# Patient Record
Sex: Female | Born: 1985 | Race: White | Hispanic: No | Marital: Single
Health system: Southern US, Community
[De-identification: ages and names within clinical notes are randomized; demographics above are authoritative.]

## PROBLEM LIST (undated history)

## (undated) ENCOUNTER — Inpatient Hospital Stay (HOSPITAL_COMMUNITY): Payer: Self-pay

## (undated) DIAGNOSIS — R000 Tachycardia, unspecified: Secondary | ICD-10-CM

## (undated) DIAGNOSIS — I469 Cardiac arrest, cause unspecified: Secondary | ICD-10-CM

## (undated) DIAGNOSIS — O24419 Gestational diabetes mellitus in pregnancy, unspecified control: Secondary | ICD-10-CM

## (undated) DIAGNOSIS — M549 Dorsalgia, unspecified: Secondary | ICD-10-CM

## (undated) DIAGNOSIS — R51 Headache: Secondary | ICD-10-CM

## (undated) DIAGNOSIS — F419 Anxiety disorder, unspecified: Secondary | ICD-10-CM

---
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
## 2006-01-04 ENCOUNTER — Inpatient Hospital Stay (HOSPITAL_COMMUNITY): Admission: AD | Admit: 2006-01-04 | Discharge: 2006-01-04 | Payer: Self-pay | Admitting: Obstetrics and Gynecology

## 2006-04-03 ENCOUNTER — Emergency Department (HOSPITAL_COMMUNITY): Admission: EM | Admit: 2006-04-03 | Discharge: 2006-04-03 | Payer: Self-pay | Admitting: Emergency Medicine

## 2006-04-22 ENCOUNTER — Emergency Department (HOSPITAL_COMMUNITY): Admission: EM | Admit: 2006-04-22 | Discharge: 2006-04-22 | Payer: Self-pay | Admitting: Emergency Medicine

## 2006-04-24 ENCOUNTER — Emergency Department (HOSPITAL_COMMUNITY): Admission: EM | Admit: 2006-04-24 | Discharge: 2006-04-24 | Payer: Self-pay | Admitting: Emergency Medicine

## 2006-06-03 ENCOUNTER — Inpatient Hospital Stay (HOSPITAL_COMMUNITY): Admission: AD | Admit: 2006-06-03 | Discharge: 2006-06-03 | Payer: Self-pay | Admitting: Gynecology

## 2006-07-21 ENCOUNTER — Emergency Department (HOSPITAL_COMMUNITY): Admission: EM | Admit: 2006-07-21 | Discharge: 2006-07-22 | Payer: Self-pay | Admitting: Emergency Medicine

## 2006-08-11 ENCOUNTER — Emergency Department (HOSPITAL_COMMUNITY): Admission: EM | Admit: 2006-08-11 | Discharge: 2006-08-11 | Payer: Self-pay | Admitting: Emergency Medicine

## 2006-08-24 ENCOUNTER — Inpatient Hospital Stay (HOSPITAL_COMMUNITY): Admission: AD | Admit: 2006-08-24 | Discharge: 2006-08-24 | Payer: Self-pay | Admitting: Gynecology

## 2006-09-08 ENCOUNTER — Inpatient Hospital Stay (HOSPITAL_COMMUNITY): Admission: AD | Admit: 2006-09-08 | Discharge: 2006-09-08 | Payer: Self-pay | Admitting: Obstetrics & Gynecology

## 2006-10-17 ENCOUNTER — Emergency Department (HOSPITAL_COMMUNITY): Admission: EM | Admit: 2006-10-17 | Discharge: 2006-10-17 | Payer: Self-pay | Admitting: Emergency Medicine

## 2006-11-10 ENCOUNTER — Inpatient Hospital Stay (HOSPITAL_COMMUNITY): Admission: AD | Admit: 2006-11-10 | Discharge: 2006-11-11 | Payer: Self-pay | Admitting: Obstetrics & Gynecology

## 2006-11-19 ENCOUNTER — Inpatient Hospital Stay (HOSPITAL_COMMUNITY): Admission: AD | Admit: 2006-11-19 | Discharge: 2006-11-20 | Payer: Self-pay | Admitting: Obstetrics and Gynecology

## 2006-11-25 ENCOUNTER — Inpatient Hospital Stay (HOSPITAL_COMMUNITY): Admission: AD | Admit: 2006-11-25 | Discharge: 2006-11-25 | Payer: Self-pay | Admitting: Obstetrics & Gynecology

## 2006-12-09 ENCOUNTER — Inpatient Hospital Stay (HOSPITAL_COMMUNITY): Admission: AD | Admit: 2006-12-09 | Discharge: 2006-12-09 | Payer: Self-pay | Admitting: Family Medicine

## 2006-12-24 ENCOUNTER — Emergency Department (HOSPITAL_COMMUNITY): Admission: EM | Admit: 2006-12-24 | Discharge: 2006-12-25 | Payer: Self-pay | Admitting: Emergency Medicine

## 2007-01-06 ENCOUNTER — Inpatient Hospital Stay (HOSPITAL_COMMUNITY): Admission: AD | Admit: 2007-01-06 | Discharge: 2007-01-06 | Payer: Self-pay | Admitting: Obstetrics & Gynecology

## 2007-01-06 ENCOUNTER — Ambulatory Visit: Payer: Self-pay | Admitting: Obstetrics & Gynecology

## 2007-01-17 ENCOUNTER — Inpatient Hospital Stay (HOSPITAL_COMMUNITY): Admission: AD | Admit: 2007-01-17 | Discharge: 2007-01-18 | Payer: Self-pay | Admitting: Obstetrics & Gynecology

## 2007-01-17 ENCOUNTER — Ambulatory Visit: Payer: Self-pay | Admitting: Physician Assistant

## 2007-02-07 ENCOUNTER — Inpatient Hospital Stay (HOSPITAL_COMMUNITY): Admission: AD | Admit: 2007-02-07 | Discharge: 2007-02-07 | Payer: Self-pay | Admitting: Family Medicine

## 2007-02-07 ENCOUNTER — Ambulatory Visit: Payer: Self-pay | Admitting: Obstetrics and Gynecology

## 2007-03-05 ENCOUNTER — Inpatient Hospital Stay (HOSPITAL_COMMUNITY): Admission: AD | Admit: 2007-03-05 | Discharge: 2007-03-05 | Payer: Self-pay | Admitting: Obstetrics & Gynecology

## 2007-03-05 ENCOUNTER — Ambulatory Visit: Payer: Self-pay | Admitting: Obstetrics and Gynecology

## 2007-03-25 ENCOUNTER — Ambulatory Visit: Payer: Self-pay | Admitting: *Deleted

## 2007-03-25 ENCOUNTER — Encounter (INDEPENDENT_AMBULATORY_CARE_PROVIDER_SITE_OTHER): Payer: Self-pay | Admitting: *Deleted

## 2007-04-01 ENCOUNTER — Ambulatory Visit: Payer: Self-pay | Admitting: Family Medicine

## 2007-04-01 ENCOUNTER — Ambulatory Visit: Payer: Self-pay | Admitting: *Deleted

## 2007-04-01 ENCOUNTER — Inpatient Hospital Stay (HOSPITAL_COMMUNITY): Admission: AD | Admit: 2007-04-01 | Discharge: 2007-04-01 | Payer: Self-pay | Admitting: Family Medicine

## 2007-04-06 ENCOUNTER — Ambulatory Visit: Payer: Self-pay | Admitting: Obstetrics & Gynecology

## 2007-04-06 ENCOUNTER — Encounter: Admission: RE | Admit: 2007-04-06 | Discharge: 2007-04-06 | Payer: Self-pay | Admitting: Obstetrics & Gynecology

## 2007-04-09 ENCOUNTER — Ambulatory Visit: Payer: Self-pay | Admitting: Obstetrics & Gynecology

## 2007-04-13 ENCOUNTER — Ambulatory Visit: Payer: Self-pay | Admitting: Obstetrics & Gynecology

## 2007-04-16 ENCOUNTER — Ambulatory Visit: Payer: Self-pay | Admitting: Gynecology

## 2007-04-20 ENCOUNTER — Ambulatory Visit: Payer: Self-pay | Admitting: Obstetrics & Gynecology

## 2007-04-23 ENCOUNTER — Ambulatory Visit (HOSPITAL_COMMUNITY): Admission: RE | Admit: 2007-04-23 | Discharge: 2007-04-23 | Payer: Self-pay | Admitting: Family Medicine

## 2007-04-23 ENCOUNTER — Ambulatory Visit: Payer: Self-pay | Admitting: Obstetrics & Gynecology

## 2007-04-25 ENCOUNTER — Inpatient Hospital Stay (HOSPITAL_COMMUNITY): Admission: AD | Admit: 2007-04-25 | Discharge: 2007-04-29 | Payer: Self-pay | Admitting: Gynecology

## 2007-04-25 ENCOUNTER — Ambulatory Visit: Payer: Self-pay | Admitting: Gynecology

## 2007-05-02 ENCOUNTER — Inpatient Hospital Stay (HOSPITAL_COMMUNITY): Admission: AD | Admit: 2007-05-02 | Discharge: 2007-05-03 | Payer: Self-pay | Admitting: Gynecology

## 2007-05-13 ENCOUNTER — Inpatient Hospital Stay (HOSPITAL_COMMUNITY): Admission: AD | Admit: 2007-05-13 | Discharge: 2007-05-13 | Payer: Self-pay | Admitting: Obstetrics & Gynecology

## 2007-11-11 ENCOUNTER — Emergency Department (HOSPITAL_COMMUNITY): Admission: EM | Admit: 2007-11-11 | Discharge: 2007-11-11 | Payer: Self-pay | Admitting: Emergency Medicine

## 2007-11-25 ENCOUNTER — Emergency Department (HOSPITAL_COMMUNITY): Admission: EM | Admit: 2007-11-25 | Discharge: 2007-11-25 | Payer: Self-pay | Admitting: Emergency Medicine

## 2008-02-01 ENCOUNTER — Emergency Department (HOSPITAL_COMMUNITY): Admission: EM | Admit: 2008-02-01 | Discharge: 2008-02-02 | Payer: Self-pay | Admitting: Emergency Medicine

## 2008-07-25 ENCOUNTER — Emergency Department (HOSPITAL_COMMUNITY): Admission: EM | Admit: 2008-07-25 | Discharge: 2008-07-26 | Payer: Self-pay | Admitting: *Deleted

## 2008-09-05 ENCOUNTER — Inpatient Hospital Stay (HOSPITAL_COMMUNITY): Admission: AD | Admit: 2008-09-05 | Discharge: 2008-09-05 | Payer: Self-pay | Admitting: Obstetrics & Gynecology

## 2008-09-05 ENCOUNTER — Ambulatory Visit: Payer: Self-pay | Admitting: Physician Assistant

## 2008-09-23 IMAGING — US US TRANSVAGINAL NON-OB
1 series · 14 of 25 positions shown · non-contrast
Comparison: None

CLINICAL DATA: Pelvic pain.

TRANSABDOMINAL AND TRANSVAGINAL ULTRASOUND OF PELVIS
TECHNIQUE: Both transabdominal and transvaginal ultrasound
examinations of the pelvis were performed including evaluation of
the uterus, ovaries, adnexal regions, and pelvic cul-de-sac.

[Series 1: unknown · 0.32mm/px · 14 of 40 slices shown]
[im 1/40]
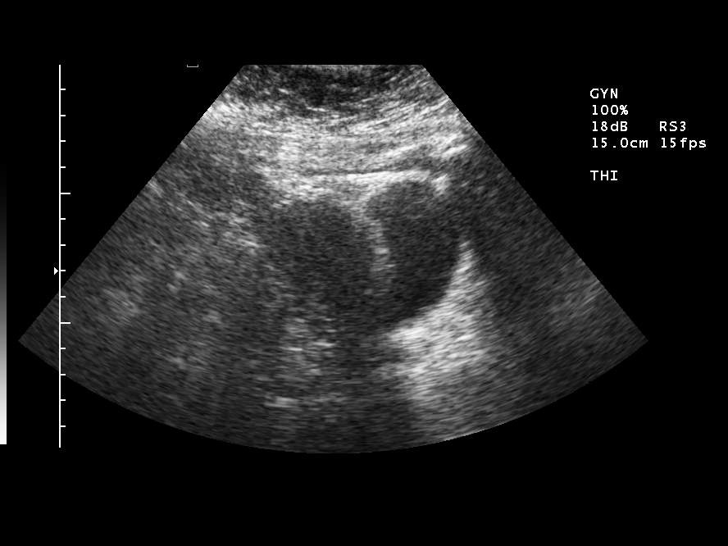
[im 4/40]
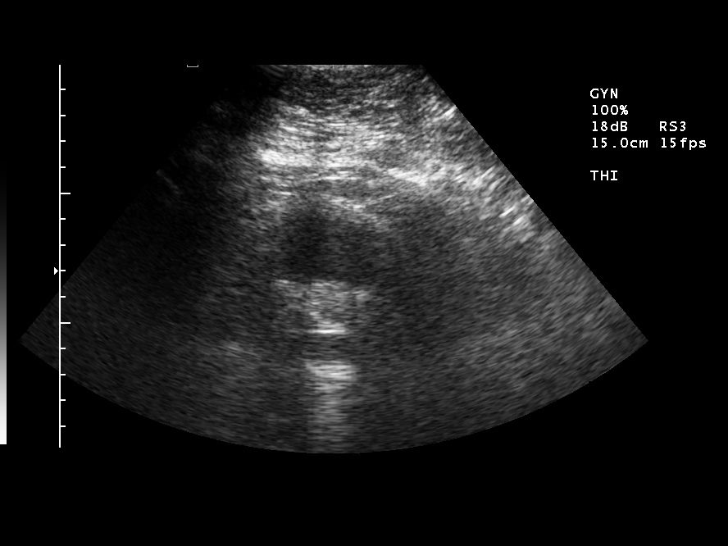
[im 7/40]
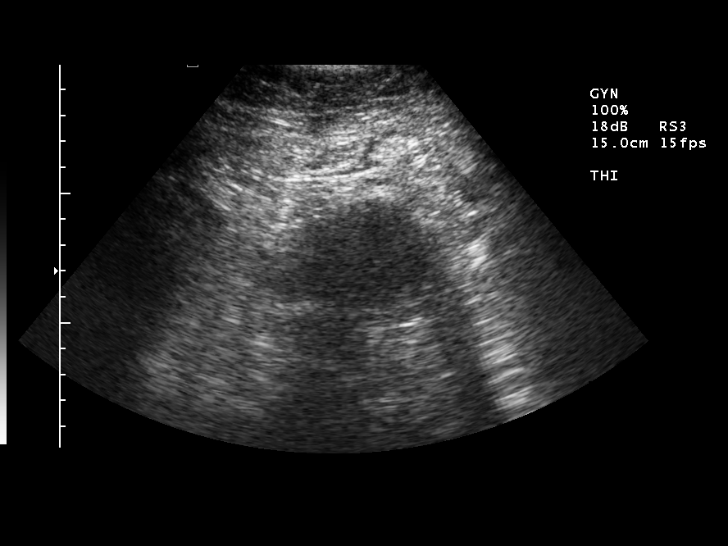
[im 10/40]
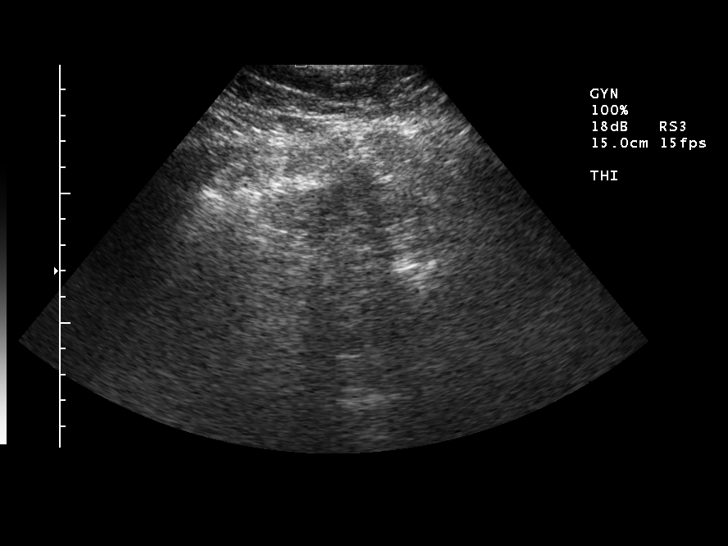
[im 14/40]
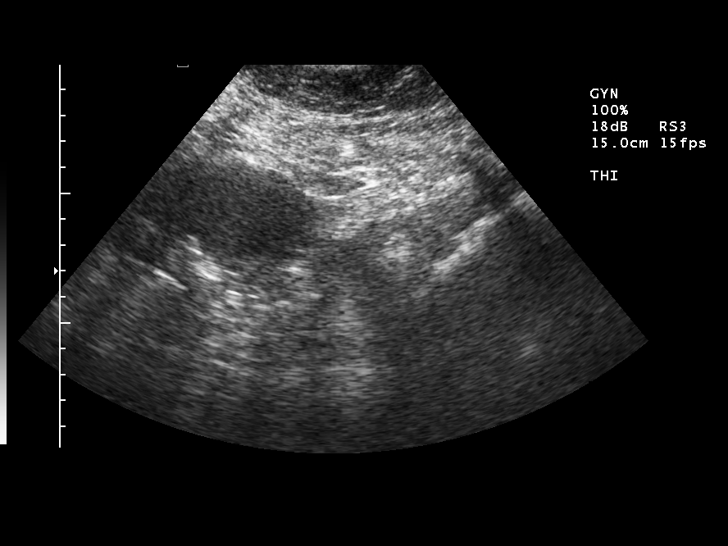
[im 15/40]
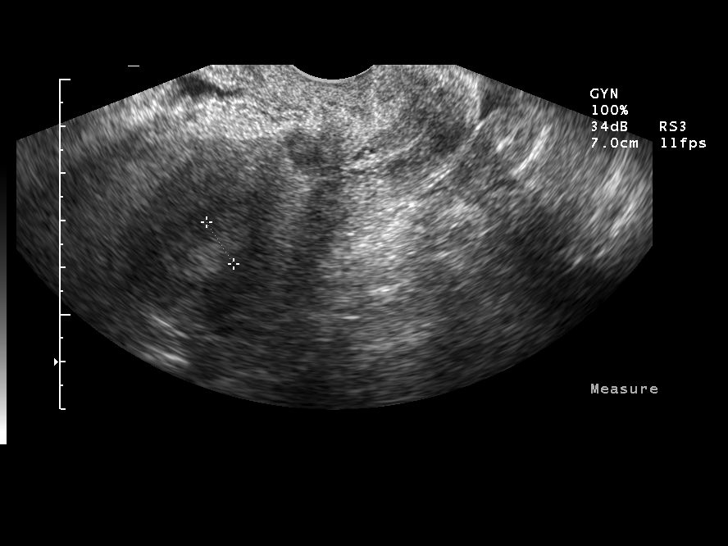
[im 18/40]
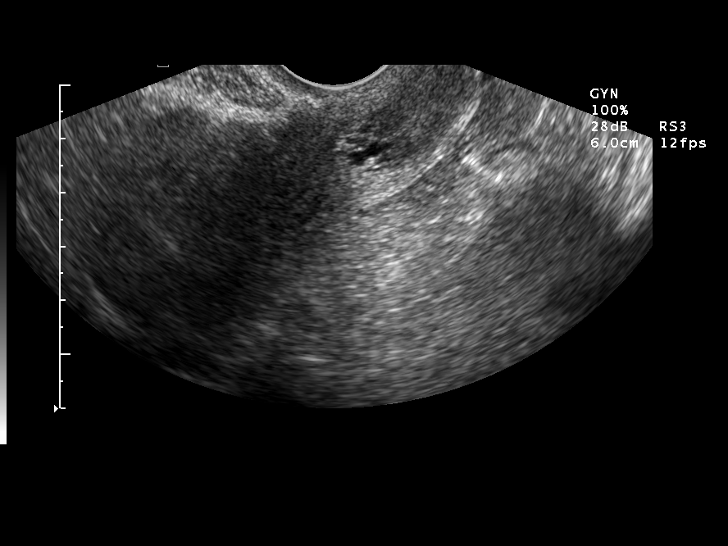
[im 22/40]
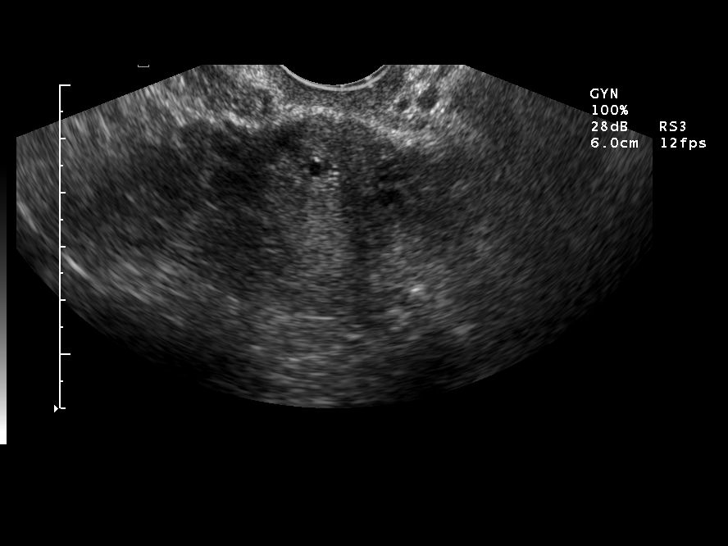
[im 25/40]
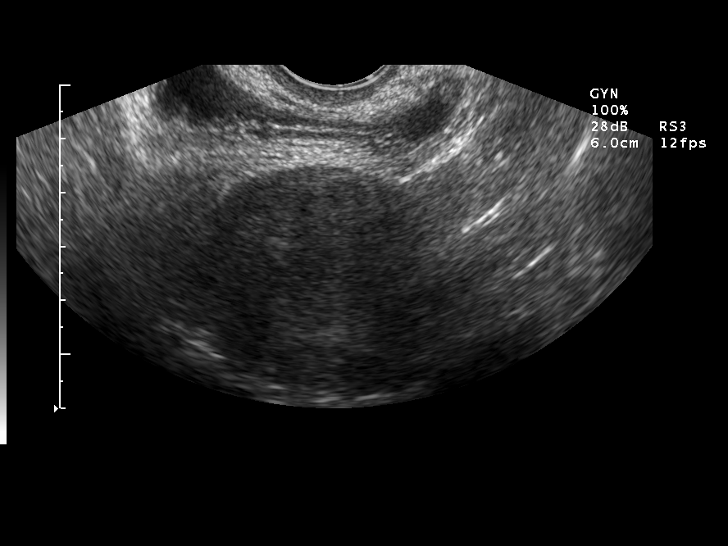
[im 27/40]
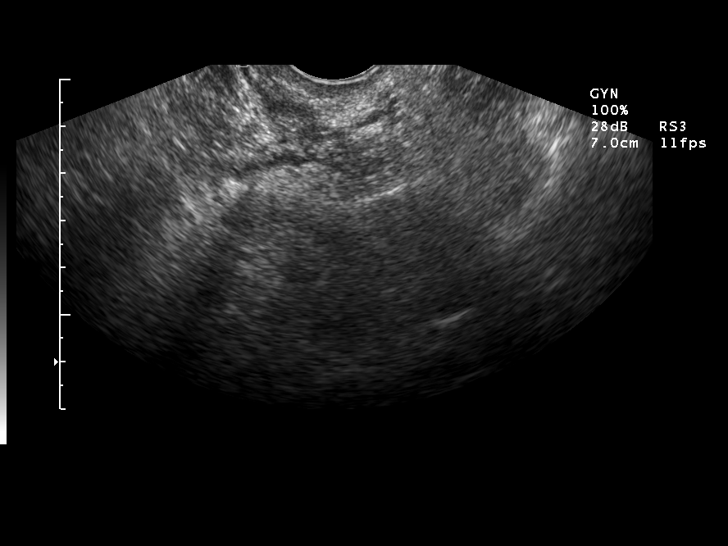
[im 30/40]
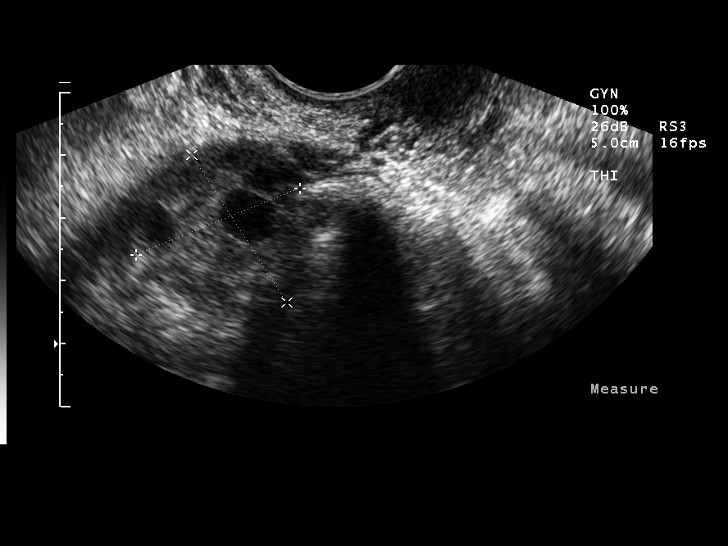
[im 33/40]
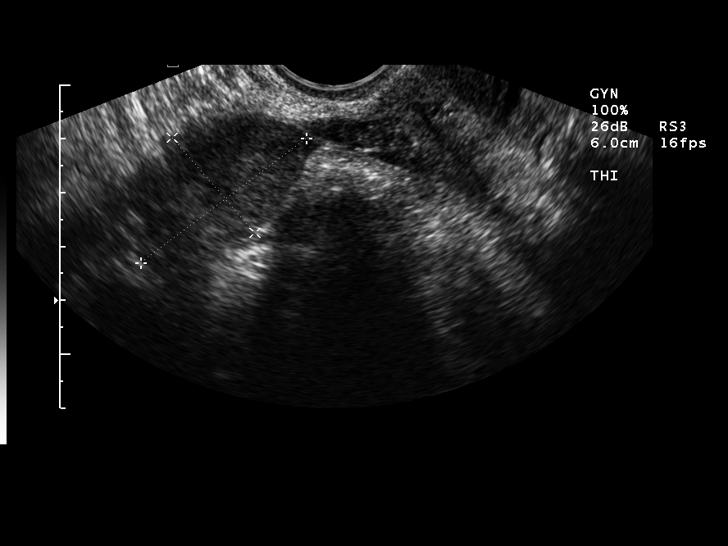
[im 36/40]
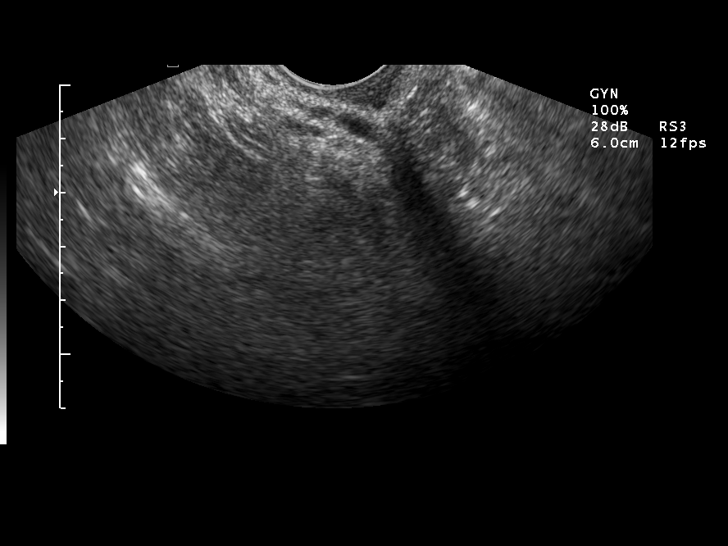
[im 40/40]
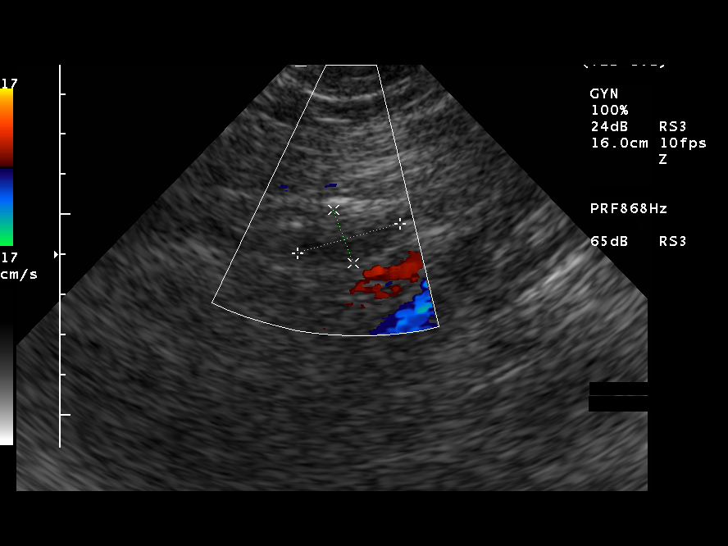

[14 of 25 positions shown; findings below may reference images not displayed]

FINDINGS: Uterus is normal in size and echotexture.  Endometrium
normal at 11 mm.  No focal uterine abnormality.

Right ovary normal size and echotexture.  Left ovary not
visualized.  No adnexal masses.  No free fluid.
IMPRESSION: No acute findings within the pelvis.

## 2008-11-26 ENCOUNTER — Emergency Department (HOSPITAL_COMMUNITY): Admission: EM | Admit: 2008-11-26 | Discharge: 2008-11-26 | Payer: Self-pay | Admitting: Emergency Medicine

## 2009-01-09 ENCOUNTER — Emergency Department (HOSPITAL_COMMUNITY): Admission: EM | Admit: 2009-01-09 | Discharge: 2009-01-09 | Payer: Self-pay | Admitting: Emergency Medicine

## 2009-01-19 ENCOUNTER — Inpatient Hospital Stay (HOSPITAL_COMMUNITY): Admission: AD | Admit: 2009-01-19 | Discharge: 2009-01-19 | Payer: Self-pay | Admitting: Obstetrics & Gynecology

## 2009-03-22 ENCOUNTER — Emergency Department (HOSPITAL_COMMUNITY): Admission: EM | Admit: 2009-03-22 | Discharge: 2009-03-23 | Payer: Self-pay | Admitting: Emergency Medicine

## 2009-06-08 ENCOUNTER — Emergency Department (HOSPITAL_COMMUNITY): Admission: EM | Admit: 2009-06-08 | Discharge: 2009-06-09 | Payer: Self-pay | Admitting: Emergency Medicine

## 2009-06-23 ENCOUNTER — Ambulatory Visit: Payer: Self-pay | Admitting: Obstetrics and Gynecology

## 2009-06-23 ENCOUNTER — Inpatient Hospital Stay (HOSPITAL_COMMUNITY): Admission: AD | Admit: 2009-06-23 | Discharge: 2009-06-23 | Payer: Self-pay | Admitting: Obstetrics & Gynecology

## 2009-07-30 ENCOUNTER — Inpatient Hospital Stay (HOSPITAL_COMMUNITY): Admission: AD | Admit: 2009-07-30 | Discharge: 2009-07-30 | Payer: Self-pay | Admitting: Obstetrics & Gynecology

## 2009-09-26 ENCOUNTER — Inpatient Hospital Stay (HOSPITAL_COMMUNITY): Admission: AD | Admit: 2009-09-26 | Discharge: 2009-09-27 | Payer: Self-pay | Admitting: Obstetrics & Gynecology

## 2009-11-20 ENCOUNTER — Emergency Department (HOSPITAL_COMMUNITY): Admission: EM | Admit: 2009-11-20 | Discharge: 2009-11-20 | Payer: Self-pay | Admitting: Emergency Medicine

## 2010-02-05 ENCOUNTER — Emergency Department (HOSPITAL_COMMUNITY): Admission: EM | Admit: 2010-02-05 | Discharge: 2010-02-05 | Payer: Self-pay | Admitting: Emergency Medicine

## 2010-05-17 ENCOUNTER — Emergency Department (HOSPITAL_COMMUNITY): Admission: EM | Admit: 2010-05-17 | Discharge: 2010-04-30 | Payer: Self-pay | Admitting: Emergency Medicine

## 2010-08-21 LAB — WOUND CULTURE

## 2010-08-23 LAB — GLUCOSE, CAPILLARY

## 2010-08-26 LAB — GLUCOSE, CAPILLARY: Glucose-Capillary: 103 mg/dL — ABNORMAL HIGH (ref 70–99)

## 2010-08-26 LAB — POCT PREGNANCY, URINE: Preg Test, Ur: NEGATIVE

## 2010-08-28 LAB — DIFFERENTIAL
Basophils Absolute: 0 10*3/uL (ref 0.0–0.1)
Lymphocytes Relative: 36 % (ref 12–46)
Lymphs Abs: 4 10*3/uL (ref 0.7–4.0)
Neutro Abs: 6.2 10*3/uL (ref 1.7–7.7)
Neutrophils Relative %: 55 % (ref 43–77)

## 2010-08-28 LAB — COMPREHENSIVE METABOLIC PANEL
BUN: 9 mg/dL (ref 6–23)
CO2: 27 mEq/L (ref 19–32)
Calcium: 9.5 mg/dL (ref 8.4–10.5)
Chloride: 100 mEq/L (ref 96–112)
Creatinine, Ser: 0.47 mg/dL (ref 0.4–1.2)
Potassium: 3.6 mEq/L (ref 3.5–5.1)
Sodium: 137 mEq/L (ref 135–145)

## 2010-08-28 LAB — CBC
HCT: 46.1 % — ABNORMAL HIGH (ref 36.0–46.0)
Hemoglobin: 15.7 g/dL — ABNORMAL HIGH (ref 12.0–15.0)
MCHC: 34 g/dL (ref 30.0–36.0)
MCV: 86.9 fL (ref 78.0–100.0)

## 2010-08-28 LAB — URINALYSIS, ROUTINE W REFLEX MICROSCOPIC
Bilirubin Urine: NEGATIVE
Ketones, ur: NEGATIVE mg/dL
Specific Gravity, Urine: 1.025 (ref 1.005–1.030)
Urobilinogen, UA: 0.2 mg/dL (ref 0.0–1.0)

## 2010-08-28 LAB — GC/CHLAMYDIA PROBE AMP, GENITAL
Chlamydia, DNA Probe: NEGATIVE
GC Probe Amp, Genital: NEGATIVE

## 2010-08-28 LAB — WET PREP, GENITAL
Trich, Wet Prep: NONE SEEN
Yeast Wet Prep HPF POC: NONE SEEN

## 2010-08-28 LAB — POCT PREGNANCY, URINE: Preg Test, Ur: NEGATIVE

## 2010-08-29 LAB — URINALYSIS, ROUTINE W REFLEX MICROSCOPIC
Bilirubin Urine: NEGATIVE
Glucose, UA: NEGATIVE mg/dL
Hgb urine dipstick: NEGATIVE
Protein, ur: NEGATIVE mg/dL
Urobilinogen, UA: 0.2 mg/dL (ref 0.0–1.0)

## 2010-09-15 LAB — POCT PREGNANCY, URINE
Preg Test, Ur: NEGATIVE
Preg Test, Ur: NEGATIVE

## 2010-09-15 LAB — URINALYSIS, ROUTINE W REFLEX MICROSCOPIC
Bilirubin Urine: NEGATIVE
Glucose, UA: NEGATIVE mg/dL
Ketones, ur: NEGATIVE mg/dL
Nitrite: NEGATIVE
Specific Gravity, Urine: 1.01 (ref 1.005–1.030)
pH: 5.5 (ref 5.0–8.0)

## 2010-09-20 LAB — URINALYSIS, ROUTINE W REFLEX MICROSCOPIC
Bilirubin Urine: NEGATIVE
Nitrite: NEGATIVE
Protein, ur: NEGATIVE mg/dL
Specific Gravity, Urine: 1.025 (ref 1.005–1.030)
Urobilinogen, UA: 0.2 mg/dL (ref 0.0–1.0)

## 2010-09-20 LAB — POCT PREGNANCY, URINE: Preg Test, Ur: NEGATIVE

## 2010-09-20 LAB — WET PREP, GENITAL
Clue Cells Wet Prep HPF POC: NONE SEEN
Trich, Wet Prep: NONE SEEN
Yeast Wet Prep HPF POC: NONE SEEN

## 2010-09-20 LAB — URINE MICROSCOPIC-ADD ON

## 2010-09-20 LAB — GC/CHLAMYDIA PROBE AMP, GENITAL: Chlamydia, DNA Probe: NEGATIVE

## 2010-09-25 LAB — URINALYSIS, ROUTINE W REFLEX MICROSCOPIC
Bilirubin Urine: NEGATIVE
Glucose, UA: NEGATIVE mg/dL
Hgb urine dipstick: NEGATIVE
Ketones, ur: NEGATIVE mg/dL
Nitrite: NEGATIVE
Protein, ur: NEGATIVE mg/dL
Specific Gravity, Urine: 1.026 (ref 1.005–1.030)
Urobilinogen, UA: 1 mg/dL (ref 0.0–1.0)
pH: 6 (ref 5.0–8.0)

## 2010-10-23 NOTE — Discharge Summary (Signed)
[name], [name]                ACCOUNT NO.:  [id number]   MEDICAL RECORD NO.:  [id number]          PATIENT TYPE:  INP   LOCATION:                                FACILITY:  [facility]   PHYSICIAN:  Abigail Carne, MD  DATE OF BIRTH:  04/24/1986   DATE OF ADMISSION:  04/25/2007  DATE OF DISCHARGE:  04/29/2007                               DISCHARGE SUMMARY   ADMISSION DIAGNOSIS:  Induction of labor secondary to class A2  gestational diabetes mellitus at [redacted] weeks gestation.   DISCHARGE DIAGNOSIS:  Postoperative day #3, status post low transverse  cesarean section for non-reassuring fetal heart tracing during induction  of labor.   SIGNIFICANT FINDINGS:  The patient's prenatal labs showed a blood type  of A positive.  Antibody screen was negative.  Hemoglobin 11.6 on  prenatal workup.  Rubella immune.  Hepatitis B surface antigen negative.  Syphilis was nonreactive.  HIV nonreactive.  Gonorrhea negative,  Chlamydia positive on October 15 and status post treatment on October  22.  She has had history of GBS positivity and 1-hour Glucola screen was  193.  Preoperative CBC showed a hemoglobin of 14.7, white blood cell  count 16.1, platelet count 307.   HOSPITAL COURSE:  The patient is a 25 year old then G3, P0-1-1-0  presented at 68 and zero days weeks of gestation for induction of labor  secondary to gestational diabetes class A2.  The patient had a history  of intrauterine fetal demise at 25 weeks with vaginal delivery of the  fetus.  She was brought in for induction and underwent induction with  Cytotec and progressed to 2 cm dilation; however, at approximately 7:25  a.m. on the day after admission, November 16, the patient had persistent  episodes of deceleration of the fetal heart tracing and an overall, non-  reassuring strip with poor uterine contractions, therefore, it was  deemed necessary to take the patient for primary low transverse cesarean  section.  On April 26, 2007,  the patient underwent low transverse  cesarean section with a low vertical infraumbilical incision.  This  procedure rendered a viable female infant with Apgars and 8 and 9 at 1  and 5 minutes respectively.  The infant weighed 6 lb, 12 oz.  The  patient tolerated the procedure well with an estimated blood loss of 600  mL.  The placenta was removed manually.  Uterus was inspected and closed  with good hemostasis.  The patient did well with the procedure and was  returned to the post-anesthesia recovery room in excellent condition.  Postoperatively, the patient had an uncomplicated course.  She was taken  off her diabetic regimen that she was on during pregnancy with good  control of daily CBGs ranging from the 60s to low 100s postoperatively.  The patient's vital signs remained stable postoperatively.  She remained  afebrile throughout the rest of her hospital course.  April 29, 2007,  postoperative day #3, the patient was deemed fit for discharge.  The  patient was ambulating well without difficulty, tolerating food by mouth  and had good urine  output and positive flatus.  The patient had not had  a bowel movement at the time of discharge but was not experiencing  symptoms of abdominal bloating and was taking a stool softener.  In the  postoperative course, the patient did have some 1-2+ pitting edema of  her lower extremities.  Her lower extremities were not tender to  palpation and were symmetric in size bilaterally.  There was no erythema  associated with her edema.  Again, the patient was ambulating without  difficulty postoperatively.  On postoperative day #3, the patient was  discharged to home in excellent and stable condition with orders written  for the baby love nurse to come to her home to remove her staples on  postoperative day 7-10.   DISCHARGE MEDICATIONS:  1. Percocet 5/325 mg 1-2 by mouth every 6 hours p.r.n. pain  2. Ibuprofen 600 mg by mouth every 6 hours p.r.n.  pain.  3. Colace 100 mg p.o. b.i.d. p.r.n. constipation.  4. Prenatal vitamins once daily for 6 weeks or while breast feeding.  5. Ortho Tri-Cyclen once daily for birth control.   DISCHARGE INSTRUCTIONS:  1. The patient is to take medications mentioned previously.  2. The patient is to follow up with the [facility]      Department in 6 weeks.  3. The patient is to have staples removed on postoperative 7-10 by      Siri Cole nurse at home.  4. The patient is to maintain pelvic rest for 6 weeks with nothing in      the vagina during this time.  5. The patient is to abstain from any heavy lifting for the next 6      weeks.  6. The patient is to return for any increased bleeding, increased      abdominal pain, increased drainage, discharge, erythema or pain      from her incision site, presyncopal symptoms, increased lower      extremity edema, erythema or pain or any asymmetry for lower      extremities.  The patient expressed understanding and agreement      with these instructions.   Prenatal lab work showed the patient was GBS positive, rubella immune  with blood type A positive.  At the time of discharge, the patient is  bottle feeding her infant without difficulty.   DISCHARGE CONDITION:  Stable, good.      Abigail Soman, MD      Abigail Carne, MD  Electronically Signed    TE/MEDQ  D:  04/29/2007  T:  04/29/2007  Job:  [phone] [id number]

## 2010-10-23 NOTE — Op Note (Signed)
[name], FORNEY                ACCOUNT NO.:  [id number]   MEDICAL RECORD NO.:  [id number]          PATIENT TYPE:  INP   LOCATION:                                FACILITY:  [facility]   PHYSICIAN:  Ginger Carne, MD  DATE OF BIRTH:  July 23, 1985   DATE OF PROCEDURE:  04/26/2007  DATE OF DISCHARGE:                               OPERATIVE REPORT   PREOPERATIVE DIAGNOSIS:  Nonreassuring fetal heart rate, A2 diabetic. [redacted]  weeks gestation.   POSTOPERATIVE DIAGNOSIS:  Nonreassuring fetal heart rate, A2 diabetic.  [redacted] weeks gestation, term viable delivery of female infant.   PROCEDURE:  Primary low transverse cesarean section.   SURGEON:  Ginger Carne, MD   ASSISTANT:  None.   COMPLICATIONS:  None immediate.   ESTIMATED BLOOD LOSS:  600 mL.   ANESTHESIA:  Epidural.   SPECIMEN:  Cord bloods and cord pH.   OPERATIVE FINDINGS:  Term infant female delivered in a vertex  presentation.  Apgar, weight, cord pH per delivery room record, no gross  abnormalities.  Baby cried spontaneously at delivery, thick meconium  staining noted.  Three-vessel cord, central insertion, complete  placenta.  Uterus, tubes and ovaries showed normal decidual changes of  pregnancy.   OPERATIVE PROCEDURE:  The patient prepped and draped in usual fashion  and placed in the left lateral supine position.  Betadine solution used  for antiseptic and the patient was catheterized prior to procedure.  After adequate epidural analgesia, a low vertical infraumbilical  incision was made.  The abdomen opened.  Bladder flap dissected and  lower uterine segment incised transversely.  Baby delivered, cord  clamped and cut and infant given to the pediatric staff after bulb  suctioning.  Placenta removed manually.  Uterus inspected.  Closure  uterine musculature in one layer with 0 Vicryl running interlocking  suture.  Bleeding points hemostatically checked.  Blood clots removed.  Closure of the fascia in one layer with  double loop 0 PDS suture running  and 3-0 Vicryl for subcutaneous layer and skin staples for the skin.  Instrument and sponge count were correct.  The patient tolerated the  procedure well, returned post anesthesia recovery room in excellent  condition.      Ginger Carne, MD  Electronically Signed     SHB/MEDQ  D:  04/26/2007  T:  04/27/2007  Job:  [id number]

## 2010-10-26 NOTE — Discharge Summary (Signed)
[name], [name]                ACCOUNT NO.:  [id number]   MEDICAL RECORD NO.:  [id number]          PATIENT TYPE:  INP   LOCATION:                                [facility]ILITY:  [facility]   PHYSICIAN:  Angie B. Merlene Morse, MD  DATE OF BIRTH:  [birth year]   DATE OF ADMISSION:  06/18/2005  DATE OF DISCHARGE:  06/21/2005                                 DISCHARGE SUMMARY   ADDENDUM:  Immediately prior to discharge the patient asked the nurse, Tonya, for more  pain medication at discharge, specifically, Percocet.  She told her that  these medications are not typically given for 21-week IUFD patients  postpartum at discharge but she demanded that the nurse call the doctor on-  call.  The nurse noted that the patient had been up smoking and walking and  did not seem to be in severe pain under multiple occasions, while hysterical  during other times and demanding.  She called me since I was on-call and  given the story I refused Percocet and offered to send her home with  ibuprofen which is a better pain medication for postpartum cramping since  its mechanism of action works to directly prevent the pain.  When the nurse  informed her of this patient threatened physical harm to me, the physician.  She said that she does not need Motrin and she does not need just Valium,  she needs Valium and Percocet together.      [name], M.D.    ______________________________  August Saucer. Merlene Morse, MD    AL/MEDQ  D:  06/21/2005  T:  06/21/2005  Job:  [id number]

## 2010-11-15 ENCOUNTER — Emergency Department (HOSPITAL_COMMUNITY)
Admission: EM | Admit: 2010-11-15 | Discharge: 2010-11-15 | Disposition: A | Discharge status: Home / Self Care | Payer: Self-pay | Attending: Emergency Medicine | Admitting: Emergency Medicine

## 2010-11-15 DIAGNOSIS — M545 Low back pain, unspecified: Secondary | ICD-10-CM | POA: Insufficient documentation

## 2010-11-15 DIAGNOSIS — G8929 Other chronic pain: Secondary | ICD-10-CM | POA: Insufficient documentation

## 2011-03-18 LAB — URINALYSIS, ROUTINE W REFLEX MICROSCOPIC
Glucose, UA: NEGATIVE
Hgb urine dipstick: NEGATIVE
Ketones, ur: NEGATIVE
Protein, ur: NEGATIVE
Urobilinogen, UA: 0.2

## 2011-03-19 LAB — CBC
HCT: 41
MCHC: 35.8
MCV: 82.5
Platelets: 307
WBC: 16.1 — ABNORMAL HIGH

## 2011-03-19 LAB — POCT URINALYSIS DIP (DEVICE)
Hgb urine dipstick: NEGATIVE
Ketones, ur: 80 — AB
Nitrite: NEGATIVE
Protein, ur: 30 — AB
Protein, ur: NEGATIVE
pH: 6.5
pH: 7

## 2011-03-19 LAB — RPR: RPR Ser Ql: NONREACTIVE

## 2011-03-20 LAB — POCT URINALYSIS DIP (DEVICE)
Bilirubin Urine: NEGATIVE
Bilirubin Urine: NEGATIVE
Bilirubin Urine: NEGATIVE
Hgb urine dipstick: NEGATIVE
Hgb urine dipstick: NEGATIVE
Ketones, ur: 15 — AB
Ketones, ur: NEGATIVE
Ketones, ur: NEGATIVE
Protein, ur: NEGATIVE
Specific Gravity, Urine: 1.015
Specific Gravity, Urine: 1.015
pH: 7
pH: 7
pH: 7

## 2011-03-21 LAB — URINALYSIS, ROUTINE W REFLEX MICROSCOPIC
Bilirubin Urine: NEGATIVE
Glucose, UA: NEGATIVE
Hgb urine dipstick: NEGATIVE
Nitrite: NEGATIVE
Specific Gravity, Urine: 1.02
pH: 6.5

## 2011-03-21 LAB — URINE CULTURE: Colony Count: 35000

## 2011-03-21 LAB — URINE MICROSCOPIC-ADD ON

## 2011-03-22 LAB — URINALYSIS, ROUTINE W REFLEX MICROSCOPIC
Bilirubin Urine: NEGATIVE
Ketones, ur: NEGATIVE
Nitrite: NEGATIVE
Protein, ur: NEGATIVE
Urobilinogen, UA: 0.2
pH: 6

## 2011-03-22 LAB — URINE CULTURE: Colony Count: 60000

## 2011-03-22 LAB — RAPID URINE DRUG SCREEN, HOSP PERFORMED
Cocaine: NOT DETECTED
Opiates: NOT DETECTED
Tetrahydrocannabinol: NOT DETECTED

## 2011-03-22 LAB — URINE MICROSCOPIC-ADD ON

## 2011-03-25 LAB — CBC
HCT: 32.9 — ABNORMAL LOW
Hemoglobin: 11.6 — ABNORMAL LOW
MCHC: 35.1
MCV: 84.1
RDW: 13.1

## 2011-03-25 LAB — DIFFERENTIAL
Basophils Absolute: 0
Basophils Relative: 0
Eosinophils Relative: 1
Monocytes Absolute: 1.1 — ABNORMAL HIGH

## 2011-03-25 LAB — GC/CHLAMYDIA PROBE AMP, GENITAL
Chlamydia, DNA Probe: NEGATIVE
GC Probe Amp, Genital: NEGATIVE

## 2011-03-25 LAB — URINALYSIS, ROUTINE W REFLEX MICROSCOPIC
Bilirubin Urine: NEGATIVE
Hgb urine dipstick: NEGATIVE
Hgb urine dipstick: NEGATIVE
Nitrite: NEGATIVE
Nitrite: NEGATIVE
Specific Gravity, Urine: 1.025
Specific Gravity, Urine: 1.015
Urobilinogen, UA: 0.2
pH: 7.5

## 2011-03-26 LAB — CBC
HCT: 34.5 — ABNORMAL LOW
MCHC: 34.8
Platelets: 353
RDW: 12.6

## 2011-03-26 LAB — GC/CHLAMYDIA PROBE AMP, GENITAL
Chlamydia, DNA Probe: NEGATIVE
GC Probe Amp, Genital: NEGATIVE

## 2011-03-26 LAB — URINALYSIS, ROUTINE W REFLEX MICROSCOPIC
Bilirubin Urine: NEGATIVE
Nitrite: NEGATIVE
Protein, ur: NEGATIVE
Specific Gravity, Urine: 1.02
Urobilinogen, UA: 0.2

## 2011-03-26 LAB — WET PREP, GENITAL
Clue Cells Wet Prep HPF POC: NONE SEEN
Trich, Wet Prep: NONE SEEN
Yeast Wet Prep HPF POC: NONE SEEN

## 2011-03-28 LAB — URINALYSIS, ROUTINE W REFLEX MICROSCOPIC
Bilirubin Urine: NEGATIVE
Glucose, UA: NEGATIVE
Hgb urine dipstick: NEGATIVE
Ketones, ur: NEGATIVE
Protein, ur: NEGATIVE
Urobilinogen, UA: 2 — ABNORMAL HIGH

## 2011-03-28 LAB — RAPID STREP SCREEN (MED CTR MEBANE ONLY): Streptococcus, Group A Screen (Direct): NEGATIVE

## 2011-07-10 ENCOUNTER — Inpatient Hospital Stay (HOSPITAL_COMMUNITY): Payer: Medicaid Other

## 2011-07-10 ENCOUNTER — Inpatient Hospital Stay (HOSPITAL_COMMUNITY)
Admission: AD | Admit: 2011-07-10 | Discharge: 2011-07-10 | Disposition: A | Discharge status: Home / Self Care | Payer: Medicaid Other | Source: Ambulatory Visit | Attending: Obstetrics and Gynecology | Admitting: Obstetrics and Gynecology

## 2011-07-10 ENCOUNTER — Encounter (HOSPITAL_COMMUNITY): Payer: Self-pay | Admitting: *Deleted

## 2011-07-10 DIAGNOSIS — M5380 Other specified dorsopathies, site unspecified: Secondary | ICD-10-CM | POA: Insufficient documentation

## 2011-07-10 DIAGNOSIS — O26899 Other specified pregnancy related conditions, unspecified trimester: Secondary | ICD-10-CM

## 2011-07-10 DIAGNOSIS — M549 Dorsalgia, unspecified: Secondary | ICD-10-CM | POA: Insufficient documentation

## 2011-07-10 DIAGNOSIS — O99891 Other specified diseases and conditions complicating pregnancy: Secondary | ICD-10-CM | POA: Insufficient documentation

## 2011-07-10 DIAGNOSIS — Z3490 Encounter for supervision of normal pregnancy, unspecified, unspecified trimester: Secondary | ICD-10-CM

## 2011-07-10 DIAGNOSIS — Z1389 Encounter for screening for other disorder: Secondary | ICD-10-CM

## 2011-07-10 HISTORY — DX: Anxiety disorder, unspecified: F41.9

## 2011-07-10 HISTORY — DX: Gestational diabetes mellitus in pregnancy, unspecified control: O24.419

## 2011-07-10 HISTORY — DX: Dorsalgia, unspecified: M54.9

## 2011-07-10 HISTORY — DX: Headache: R51

## 2011-07-10 HISTORY — DX: Cardiac arrest, cause unspecified: I46.9

## 2011-07-10 HISTORY — DX: Tachycardia, unspecified: R00.0

## 2011-07-10 LAB — URINALYSIS, ROUTINE W REFLEX MICROSCOPIC
Bilirubin Urine: NEGATIVE
Glucose, UA: NEGATIVE mg/dL
Hgb urine dipstick: NEGATIVE
Ketones, ur: NEGATIVE mg/dL
Specific Gravity, Urine: 1.015 (ref 1.005–1.030)
pH: 7 (ref 5.0–8.0)

## 2011-07-10 MED ORDER — CYCLOBENZAPRINE HCL 10 MG PO TABS: 10.0000 mg | ORAL_TABLET | Freq: Three times a day (TID) | ORAL | Status: DC | PRN

## 2011-07-10 MED ORDER — CYCLOBENZAPRINE HCL 10 MG PO TABS
10.0000 mg | ORAL_TABLET | Freq: Once | ORAL | Status: AC
  Administered 2011-07-10: 10 mg via ORAL
  Filled 2011-07-10: qty 1

## 2011-07-10 NOTE — ED Provider Notes (Signed)
History   Patient is a 26 year old, pregnant, female at [redacted]w[redacted]d who presents with chief complaint of back pain and spasms that radiate to her left hip and down her left leg.  Patient has a significant history of back pain dating to 2007 and she currently takes no medications nor does she have a PCP.  Back pain increased in intensity 2 weeks ago and is most painful down her spine, over her sacrum and over her left hip.  The pain is constant and sharp and she has back spasms.  A hot shower provides temporary relief of her back pain.  She reports nausea, vomiting and abdominal cramps and increased urinary frequency and urgency.  Denies fever, chest pain, shortness of breath, vaginal discharge, vaginal bleeding, dysuria, hematuria.  Patient acknowledges a history of sciatica and anxiety.  She has a history of still birth in 2007 and miscarriage in 2008 and is concerned there is an association between back pain and loss of pregnancy.    Chief Complaint  Patient presents with  . Back Pain   HPI  Past Medical History  Diagnosis Date  . Back pain   . Gestational diabetes   . Cardiac arrest   . Headache   . Asthma   . Anxiety   . Tachycardia     Past Surgical History  Procedure Date  . Cesarean section     No family history on file.  History  Substance Use Topics  . Smoking status: Current Everyday Smoker -- 0.5 packs/day  . Smokeless tobacco: Not on file  . Alcohol Use: No    Allergies:  Allergies  Allergen Reactions  . Stadol (Butorphanol Tartrate) Other (See Comments)    Reaction Cardiac arrest,    Prescriptions prior to admission  Medication Sig Dispense Refill  . ibuprofen (ADVIL,MOTRIN) 200 MG tablet Take 800 mg by mouth every 6 (six) hours as needed. Takes for pain      . Multiple Vitamin (MULITIVITAMIN WITH MINERALS) TABS Take 1 tablet by mouth daily.        Review of Systems  Constitutional: Negative for fever and chills.  Eyes: Negative for blurred vision.    Respiratory: Negative for shortness of breath.   Cardiovascular: Negative for chest pain.  Gastrointestinal: Positive for nausea, vomiting and abdominal pain. Negative for diarrhea and blood in stool.  Genitourinary: Positive for urgency, frequency and flank pain. Negative for dysuria and hematuria.  Musculoskeletal: Positive for back pain (see HPI). Negative for falls.  Neurological: Negative for dizziness, loss of consciousness and headaches.   Physical Exam   Blood pressure 124/72, pulse 83, temperature 98.7 F (37.1 C), temperature source Oral, resp. rate 18, height 5\' 2"  (1.575 m), weight 81.375 kg (179 lb 6.4 oz), last menstrual period 05/31/2011, SpO2 99.00%.  Physical Exam  Constitutional: She is oriented to person, place, and time. She appears well-developed and well-nourished.  HENT:  Head: Normocephalic and atraumatic.  Cardiovascular: Normal rate, regular rhythm, normal heart sounds and intact distal pulses.   No murmur heard. Respiratory: Effort normal and breath sounds normal. No respiratory distress. She has no wheezes.  GI: Soft. Bowel sounds are normal. She exhibits no distension and no mass. There is tenderness (moderate hypogastric). There is no rebound and no guarding.  Musculoskeletal: She exhibits tenderness (lumbar spine and left lumbar region).  Neurological: She is alert and oriented to person, place, and time. She has normal reflexes.  Skin: Skin is warm and dry.  Psychiatric: She has a  normal mood and affect. Her behavior is normal.    MAU Course  Procedures Urinalysis UPT Ob US <14 weeks  Flexeril given in MAU  Assessment and Plan  Assessment 1) IUP confirmed by ultrasound 2) Musculoskeletal back pain/spasms Plan 1) Reassured patient that pregnancy is an IUP and a small yolk sac was seen on [place].  Educated on signs/symptoms (vaginal bleeding, abdominal pain) that merit return to OB/GYN care if concerned with pregnancy loss.    2) Rx Flexeril for  back spasms.   3) Encouraged patient to establish care with a PCP for back pain/spasms when she is insured. 4) Encouraged patient to establish care with OB/GYN provider of her choice.   5) Discharge to home.    Derek Jack 07/10/2011, 3:06 PM   I was present for the exam and agree with the assessment and plan.  Clinton Gallant. Marian Grandt III, DrHSc, MPAS, PA-C   Henrietta Hoover, [place] 07/10/11 [phone]

## 2011-07-10 NOTE — Progress Notes (Signed)
Patient states she has had problems with her back for a long time. Has been worse for the past 2 weeks and severe and radiating into left hip for the past 3 days, unable to sleep. Has had several positive home pregnancy tests. No bleeding and slight clear discharge.

## 2011-07-10 NOTE — ED Notes (Signed)
Pt's dad had 5 different bone disorders; pt has had back pain since age 27 yr but worsened after 2007 when she delivered her child;

## 2011-07-12 ENCOUNTER — Inpatient Hospital Stay (HOSPITAL_COMMUNITY)
Admission: AD | Admit: 2011-07-12 | Discharge: 2011-07-12 | Disposition: A | Discharge status: Home / Self Care | Payer: Medicaid Other | Source: Ambulatory Visit | Attending: Obstetrics & Gynecology | Admitting: Obstetrics & Gynecology

## 2011-07-12 ENCOUNTER — Encounter (HOSPITAL_COMMUNITY): Payer: Self-pay | Admitting: *Deleted

## 2011-07-12 DIAGNOSIS — O200 Threatened abortion: Secondary | ICD-10-CM | POA: Insufficient documentation

## 2011-07-12 LAB — CBC
HCT: 43.1 % (ref 36.0–46.0)
Hemoglobin: 14.8 g/dL (ref 12.0–15.0)
MCV: 85.5 fL (ref 78.0–100.0)
RBC: 5.04 MIL/uL (ref 3.87–5.11)
RDW: 12.1 % (ref 11.5–15.5)
WBC: 13.2 10*3/uL — ABNORMAL HIGH (ref 4.0–10.5)

## 2011-07-12 LAB — WET PREP, GENITAL
Clue Cells Wet Prep HPF POC: NONE SEEN
Trich, Wet Prep: NONE SEEN

## 2011-07-12 LAB — ABO/RH: ABO/RH(D): A POS

## 2011-07-12 LAB — HCG, QUANTITATIVE, PREGNANCY: hCG, Beta Chain, Quant, S: 602 m[IU]/mL — ABNORMAL HIGH (ref <5)

## 2011-07-12 MED ORDER — IBUPROFEN 600 MG PO TABS
600.0000 mg | ORAL_TABLET | ORAL | Status: AC
  Administered 2011-07-12: 600 mg via ORAL
  Filled 2011-07-12: qty 1

## 2011-07-12 NOTE — ED Provider Notes (Signed)
History    [id number] at [redacted]w[redacted]d by U/S on 07/10/11 presents to MAU with bright red bleeding, saturating 1 pad in 3 hours, and cramping abdominal pain with onset simultaneous with bleeding.  She reports "the pain is similar to when I had my miscarriage, but I bled more that time".    Chief Complaint  Patient presents with  . Vaginal Bleeding   HPI  OB History    Grav Para Term Preterm Abortions TAB SAB Ect Mult Living   4 2  1 1  1   1       Past Medical History  Diagnosis Date  . Back pain   . Gestational diabetes   . Cardiac arrest   . Headache   . Asthma   . Anxiety   . Tachycardia     Past Surgical History  Procedure Date  . Cesarean section     History reviewed. No pertinent family history.  History  Substance Use Topics  . Smoking status: Current Everyday Smoker -- 0.5 packs/day for 6 years    Types: Cigarettes  . Smokeless tobacco: Not on file  . Alcohol Use: No    Allergies:  Allergies  Allergen Reactions  . Stadol (Butorphanol Tartrate) Other (See Comments)    Reaction Cardiac arrest,  . Flexeril (Cyclobenzaprine Hcl) Other (See Comments)    Legs will not stay still and can't sleep at night.    Prescriptions prior to admission  Medication Sig Dispense Refill  . cyclobenzaprine (FLEXERIL) 10 MG tablet Take 1 tablet (10 mg total) by mouth 3 (three) times daily as needed for muscle spasms.  30 tablet  0  . ibuprofen (ADVIL,MOTRIN) 200 MG tablet Take 800 mg by mouth every 6 (six) hours as needed. Takes for pain      . Multiple Vitamin (MULITIVITAMIN WITH MINERALS) TABS Take 1 tablet by mouth daily.        Review of Systems  Constitutional: Negative.   HENT: Negative.   Eyes: Negative.   Respiratory: Negative.   Cardiovascular: Negative.   Gastrointestinal: Negative.   Genitourinary: Negative.   Musculoskeletal: Negative.   Skin: Negative.   Neurological: Negative.   Endo/Heme/Allergies: Negative.   Psychiatric/Behavioral: Negative.    Physical Exam    Blood pressure 132/75, pulse 91, temperature 98.9 F (37.2 C), temperature source Oral, resp. rate 20, height 5\' 4"  (1.626 m), weight 81.647 kg (180 lb), last menstrual period 05/31/2011, SpO2 99.00%.  Physical Exam  Constitutional: She is oriented to person, place, and time. She appears well-developed and well-nourished.  Neck: Normal range of motion.  Respiratory: Effort normal.  GI: Soft.  Neurological: She is alert and oriented to person, place, and time.  Skin: Skin is warm and dry.  Psychiatric: She has a normal mood and affect. Her behavior is normal. Judgment and thought content normal.   Results for orders placed during the hospital encounter of 07/12/11 (from the past 24 hour(s))  HCG, QUANTITATIVE, PREGNANCY     Status: Abnormal   Collection Time   07/12/11 11:10 AM      Component Value Range   hCG, Beta Chain, Quant, S 602 (*) <5 (mIU/mL)  CBC     Status: Abnormal   Collection Time   07/12/11 11:10 AM      Component Value Range   WBC 13.2 (*) 4.0 - 10.5 (K/uL)   RBC 5.04  3.87 - 5.11 (MIL/uL)   Hemoglobin 14.8  12.0 - 15.0 (g/dL)   HCT 14.7  82.9 -  46.0 (%)   MCV 85.5  78.0 - 100.0 (fL)   MCH 29.4  26.0 - 34.0 (pg)   MCHC 34.3  30.0 - 36.0 (g/dL)   RDW 16.1  09.6 - 04.5 (%)   Platelets 307  150 - 400 (K/uL)  ABO/RH     Status: Normal   Collection Time   07/12/11 11:10 AM      Component Value Range   ABO/RH(D) A POS    WET PREP, GENITAL     Status: Abnormal   Collection Time   07/12/11 11:40 AM      Component Value Range   Yeast Wet Prep HPF POC NONE SEEN  NONE SEEN    Trich, Wet Prep NONE SEEN  NONE SEEN    Clue Cells Wet Prep HPF POC NONE SEEN  NONE SEEN    WBC, Wet Prep HPF POC FEW (*) NONE SEEN    Ultrasound on 07/10/11 indicates IUP at 5w with yolk sac visualized.  No cardiac activity visualized.    MAU Course  Procedures Pelvic exam with wet prep, gc/chlamydia Quantitative hCG   Assessment and Plan  A: Threatened miscarriage  P: D/C home with  bleeding precautions Return to MAU in 48 hours for redraw of Quantitative hCG  LEFTWICH-KIRBY, Dequandre Cordova 07/12/2011, 11:18 AM

## 2011-07-12 NOTE — Progress Notes (Signed)
Pink discharge noted when wiped last night.  At 0330 woke up had a lot of bleeding, passing clots.  Has been cramping, hx of SAB, was here 2 days ago because of cramping.

## 2011-07-12 NOTE — ED Provider Notes (Signed)
Agree with above note.  [name] 07/12/2011 5:54 AM   

## 2011-07-13 LAB — GC/CHLAMYDIA PROBE AMP, GENITAL
Chlamydia, DNA Probe: NEGATIVE
GC Probe Amp, Genital: NEGATIVE

## 2011-07-15 NOTE — ED Provider Notes (Signed)
Attestation of Attending Supervision of Advanced Practitioner: Evaluation and management procedures were performed by the PA/NP/CNM/OB Fellow under my supervision/collaboration. Chart reviewed, and agree with management and plan.  Armella Stogner, M.D. 07/15/2011 4:30 PM   

## 2012-11-19 ENCOUNTER — Emergency Department (HOSPITAL_COMMUNITY)
Admission: EM | Admit: 2012-11-19 | Discharge: 2012-11-19 | Discharge status: Against Medical Advice | Payer: Medicaid Other | Attending: Emergency Medicine | Admitting: Emergency Medicine

## 2012-11-19 DIAGNOSIS — R569 Unspecified convulsions: Secondary | ICD-10-CM | POA: Insufficient documentation

## 2012-11-19 DIAGNOSIS — J45909 Unspecified asthma, uncomplicated: Secondary | ICD-10-CM | POA: Insufficient documentation

## 2012-11-19 DIAGNOSIS — F17200 Nicotine dependence, unspecified, uncomplicated: Secondary | ICD-10-CM | POA: Insufficient documentation

## 2012-11-19 NOTE — ED Notes (Signed)
Brought in by EMS from home with c/o seizures.  Per EMS, pt was postictal on their arrival-- pt was confused; per family member's report--- pt had a grandmal seizures that lasted about a minute, pt has not been taking his seizure medication.

## 2012-11-19 NOTE — ED Notes (Signed)
Pt requesting to leave facility without further treatment. Explained to pt the risk for leaving. Family was present. Pt verbalized understanding.

## 2012-11-19 NOTE — ED Notes (Signed)
JXB:[id number]<GN> Expected date:11/19/12<BR> Expected time:12:58 AM<BR> Means of arrival:Ambulance<BR> Comments:<BR> seizure

## 2012-12-15 ENCOUNTER — Inpatient Hospital Stay (HOSPITAL_COMMUNITY): Payer: Medicaid Other

## 2012-12-15 ENCOUNTER — Inpatient Hospital Stay (HOSPITAL_COMMUNITY)
Admission: EM | Admit: 2012-12-15 | Discharge: 2013-01-08 | DRG: 917 | Disposition: E | Payer: Medicaid Other | Attending: Internal Medicine | Admitting: Internal Medicine

## 2012-12-15 ENCOUNTER — Encounter (HOSPITAL_COMMUNITY): Payer: Self-pay | Admitting: Radiology

## 2012-12-15 DIAGNOSIS — R739 Hyperglycemia, unspecified: Secondary | ICD-10-CM

## 2012-12-15 DIAGNOSIS — R579 Shock, unspecified: Secondary | ICD-10-CM | POA: Diagnosis present

## 2012-12-15 DIAGNOSIS — Z66 Do not resuscitate: Secondary | ICD-10-CM | POA: Diagnosis present

## 2012-12-15 DIAGNOSIS — J690 Pneumonitis due to inhalation of food and vomit: Secondary | ICD-10-CM

## 2012-12-15 DIAGNOSIS — I469 Cardiac arrest, cause unspecified: Secondary | ICD-10-CM

## 2012-12-15 DIAGNOSIS — G9340 Encephalopathy, unspecified: Secondary | ICD-10-CM

## 2012-12-15 DIAGNOSIS — E872 Acidosis, unspecified: Secondary | ICD-10-CM | POA: Diagnosis present

## 2012-12-15 DIAGNOSIS — R7309 Other abnormal glucose: Secondary | ICD-10-CM

## 2012-12-15 DIAGNOSIS — G40909 Epilepsy, unspecified, not intractable, without status epilepticus: Secondary | ICD-10-CM | POA: Diagnosis present

## 2012-12-15 DIAGNOSIS — R4020 Unspecified coma: Secondary | ICD-10-CM | POA: Diagnosis present

## 2012-12-15 DIAGNOSIS — G936 Cerebral edema: Secondary | ICD-10-CM | POA: Diagnosis present

## 2012-12-15 DIAGNOSIS — T50904A Poisoning by unspecified drugs, medicaments and biological substances, undetermined, initial encounter: Secondary | ICD-10-CM | POA: Diagnosis present

## 2012-12-15 DIAGNOSIS — J9600 Acute respiratory failure, unspecified whether with hypoxia or hypercapnia: Secondary | ICD-10-CM

## 2012-12-15 DIAGNOSIS — T50901A Poisoning by unspecified drugs, medicaments and biological substances, accidental (unintentional), initial encounter: Principal | ICD-10-CM | POA: Diagnosis present

## 2012-12-15 DIAGNOSIS — K7200 Acute and subacute hepatic failure without coma: Secondary | ICD-10-CM | POA: Diagnosis present

## 2012-12-15 LAB — PROTIME-INR: Prothrombin Time: 17.3 seconds — ABNORMAL HIGH (ref 11.6–15.2)

## 2012-12-15 LAB — URINALYSIS, ROUTINE W REFLEX MICROSCOPIC
Glucose, UA: NEGATIVE mg/dL
Hgb urine dipstick: NEGATIVE
Leukocytes, UA: NEGATIVE
Specific Gravity, Urine: 1.024 (ref 1.005–1.030)
Urobilinogen, UA: 1 mg/dL (ref 0.0–1.0)

## 2012-12-15 LAB — POCT I-STAT, CHEM 8
BUN: 13 mg/dL (ref 6–23)
Calcium, Ion: 1.08 mmol/L — ABNORMAL LOW (ref 1.12–1.23)
HCT: 37 % (ref 36.0–46.0)
Hemoglobin: 12.6 g/dL (ref 12.0–15.0)
Sodium: 140 mEq/L (ref 135–145)
TCO2: 16 mmol/L (ref 0–100)

## 2012-12-15 LAB — CBC WITH DIFFERENTIAL/PLATELET
Basophils Absolute: 0.1 10*3/uL (ref 0.0–0.1)
Basophils Relative: 1 % (ref 0–1)
HCT: 44.4 % (ref 36.0–46.0)
Hemoglobin: 14.2 g/dL (ref 12.0–15.0)
Lymphocytes Relative: 64 % — ABNORMAL HIGH (ref 12–46)
Lymphs Abs: 8.2 10*3/uL — ABNORMAL HIGH (ref 0.7–4.0)
MCV: 92.9 fL (ref 78.0–100.0)
Monocytes Relative: 5 % (ref 3–12)
Neutro Abs: 3.6 10*3/uL (ref 1.7–7.7)
RDW: 12.6 % (ref 11.5–15.5)
WBC: 12.8 10*3/uL — ABNORMAL HIGH (ref 4.0–10.5)

## 2012-12-15 LAB — POCT I-STAT 3, ART BLOOD GAS (G3+)
Bicarbonate: 12 mEq/L — ABNORMAL LOW (ref 20.0–24.0)
pCO2 arterial: 112.2 mmHg (ref 35.0–45.0)
pO2, Arterial: 70 mmHg — ABNORMAL LOW (ref 80.0–100.0)

## 2012-12-15 LAB — COMPREHENSIVE METABOLIC PANEL
ALT: 91 U/L — ABNORMAL HIGH (ref 0–35)
Alkaline Phosphatase: 68 U/L (ref 39–117)
BUN: 12 mg/dL (ref 6–23)
CO2: 12 mEq/L — ABNORMAL LOW (ref 19–32)
Chloride: 101 mEq/L (ref 96–112)
GFR calc Af Amer: >90 mL/min (ref >90)
GFR calc non Af Amer: 81 mL/min — ABNORMAL LOW (ref >90)
Glucose, Bld: 346 mg/dL — ABNORMAL HIGH (ref 70–99)
Potassium: 4.6 mEq/L (ref 3.5–5.1)
Sodium: 141 mEq/L (ref 135–145)
Total Bilirubin: 0.1 mg/dL — ABNORMAL LOW (ref 0.3–1.2)

## 2012-12-15 LAB — POCT I-STAT 3, VENOUS BLOOD GAS (G3P V)
Acid-base deficit: 11 mmol/L — ABNORMAL HIGH (ref 0.0–2.0)
O2 Saturation: 57 %
Patient temperature: 33.4
TCO2: 20 mmol/L (ref 0–100)

## 2012-12-15 LAB — PREGNANCY, URINE: Preg Test, Ur: NEGATIVE

## 2012-12-15 LAB — RAPID URINE DRUG SCREEN, HOSP PERFORMED
Amphetamines: POSITIVE — AB
Cocaine: NOT DETECTED
Opiates: POSITIVE — AB
Tetrahydrocannabinol: POSITIVE — AB

## 2012-12-15 LAB — PATHOLOGIST SMEAR REVIEW: Path Review: REACTIVE

## 2012-12-15 LAB — CG4 I-STAT (LACTIC ACID): Lactic Acid, Venous: 15.01 mmol/L — ABNORMAL HIGH (ref 0.5–2.2)

## 2012-12-15 LAB — GLUCOSE, CAPILLARY
Glucose-Capillary: 268 mg/dL — ABNORMAL HIGH (ref 70–99)
Glucose-Capillary: 220 mg/dL — ABNORMAL HIGH (ref 70–99)

## 2012-12-15 LAB — POCT I-STAT TROPONIN I

## 2012-12-15 MED ORDER — SODIUM CHLORIDE 0.9 % IV SOLN
25.0000 ug/h | INTRAVENOUS | Status: DC
  Filled 2012-12-15: qty 50

## 2012-12-15 MED ORDER — NOREPINEPHRINE BITARTRATE 1 MG/ML IJ SOLN
0.5000 ug/min | INTRAVENOUS | Status: DC
  Administered 2012-12-15: 10 ug/min via INTRAVENOUS
  Filled 2012-12-15: qty 4

## 2012-12-15 MED ORDER — HEPARIN SODIUM (PORCINE) 5000 UNIT/ML IJ SOLN
5000.0000 [IU] | Freq: Three times a day (TID) | INTRAMUSCULAR | Status: DC
  Administered 2012-12-15: 5000 [IU] via SUBCUTANEOUS
  Filled 2012-12-15 (×4): qty 1

## 2012-12-15 MED ORDER — AMPICILLIN-SULBACTAM SODIUM 3 (2-1) G IJ SOLR
3.0000 g | Freq: Four times a day (QID) | INTRAMUSCULAR | Status: DC
  Administered 2012-12-15: 3 g via INTRAVENOUS
  Filled 2012-12-15 (×7): qty 3

## 2012-12-15 MED ORDER — SODIUM CHLORIDE 0.9 % IV BOLUS (SEPSIS)
1000.0000 mL | Freq: Once | INTRAVENOUS | Status: AC
  Administered 2012-12-15: 1000 mL via INTRAVENOUS

## 2012-12-15 MED ORDER — INSULIN ASPART 100 UNIT/ML ~~LOC~~ SOLN
0.0000 [IU] | SUBCUTANEOUS | Status: DC
  Administered 2012-12-15: 8 [IU] via SUBCUTANEOUS
  Administered 2012-12-15: 5 [IU] via SUBCUTANEOUS

## 2012-12-15 MED ORDER — PANTOPRAZOLE SODIUM 40 MG IV SOLR
40.0000 mg | INTRAVENOUS | Status: DC
  Administered 2012-12-15: 40 mg via INTRAVENOUS
  Filled 2012-12-15: qty 40

## 2012-12-15 MED ORDER — DEXTROSE 5 % IV SOLN
INTRAVENOUS | Status: DC
  Filled 2012-12-15 (×2): qty 150

## 2012-12-15 MED ORDER — NALOXONE HCL 1 MG/ML IJ SOLN
0.2500 mg/h | Freq: Once | INTRAVENOUS | Status: DC
  Filled 2012-12-15: qty 4

## 2012-12-15 MED ORDER — NOREPINEPHRINE BITARTRATE 1 MG/ML IJ SOLN
0.5000 ug/min | INTRAVENOUS | Status: DC
  Filled 2012-12-15 (×2): qty 16

## 2012-12-15 MED ORDER — NALOXONE HCL 1 MG/ML IJ SOLN
INTRAMUSCULAR | Status: AC | PRN
  Administered 2012-12-15 (×2): 2 mg via INTRAMUSCULAR

## 2012-12-15 MED ORDER — ASPIRIN 300 MG RE SUPP: 300.0000 mg | RECTAL | Status: DC

## 2012-12-15 MED ORDER — ARTIFICIAL TEARS OP OINT
1.0000 "application " | TOPICAL_OINTMENT | Freq: Three times a day (TID) | OPHTHALMIC | Status: DC
  Filled 2012-12-15: qty 3.5

## 2012-12-15 MED ORDER — EPINEPHRINE HCL 0.1 MG/ML IJ SOSY
PREFILLED_SYRINGE | INTRAMUSCULAR | Status: AC | PRN
  Administered 2012-12-15: 1 mg via INTRAVENOUS

## 2012-12-15 MED ORDER — CHLORHEXIDINE GLUCONATE 0.12 % MT SOLN
15.0000 mL | Freq: Two times a day (BID) | OROMUCOSAL | Status: DC
  Filled 2012-12-15: qty 15

## 2012-12-15 MED ORDER — SODIUM CHLORIDE 0.9 % IV SOLN
1.0000 ug/kg/min | INTRAVENOUS | Status: DC
  Filled 2012-12-15: qty 20

## 2012-12-15 MED ORDER — CISATRACURIUM BOLUS VIA INFUSION
0.0500 mg/kg | Freq: Once | INTRAVENOUS | Status: DC | PRN
  Filled 2012-12-15: qty 4

## 2012-12-15 MED ORDER — SODIUM BICARBONATE 8.4 % IV SOLN
50.0000 meq | Freq: Once | INTRAVENOUS | Status: AC
  Administered 2012-12-15: 50 meq via INTRAVENOUS

## 2012-12-15 MED ORDER — CISATRACURIUM BOLUS VIA INFUSION
0.1000 mg/kg | Freq: Once | INTRAVENOUS | Status: DC
  Filled 2012-12-15: qty 7

## 2012-12-15 MED ORDER — NALOXONE HCL 1 MG/ML IJ SOLN
0.5000 mg/h | INTRAVENOUS | Status: DC
  Administered 2012-12-15: 0.5 mg/h via INTRAVENOUS
  Filled 2012-12-15: qty 4

## 2012-12-15 MED ORDER — SODIUM CHLORIDE 0.9 % IV SOLN
2000.0000 mL | Freq: Once | INTRAVENOUS | Status: AC
  Administered 2012-12-15: 2000 mL via INTRAVENOUS

## 2012-12-15 MED ORDER — NOREPINEPHRINE BITARTRATE 1 MG/ML IJ SOLN
INTRAMUSCULAR | Status: AC
  Administered 2012-12-15: 4 mg
  Filled 2012-12-15: qty 4

## 2012-12-15 MED ORDER — SODIUM CHLORIDE 0.9 % IV SOLN
1.0000 mg/h | INTRAVENOUS | Status: DC
  Filled 2012-12-15: qty 10

## 2012-12-15 MED ORDER — CISATRACURIUM BOLUS VIA INFUSION: 0.0500 mg/kg | Freq: Once | INTRAVENOUS | Status: DC | PRN

## 2012-12-15 MED ORDER — BIOTENE DRY MOUTH MT LIQD
1.0000 "application " | Freq: Four times a day (QID) | OROMUCOSAL | Status: DC
  Administered 2012-12-15: 15 mL via OROMUCOSAL

## 2012-12-15 MED ORDER — DEXTROSE 5 % IV SOLN
INTRAVENOUS | Status: AC
  Filled 2012-12-15: qty 250

## 2012-12-15 MED ORDER — SODIUM CHLORIDE 0.9 % IV SOLN: 1.0000 ug/kg/min | INTRAVENOUS | Status: DC

## 2012-12-15 MED FILL — Medication: Qty: 1 | Status: AC

## 2012-12-15 NOTE — Code Documentation (Addendum)
Pt arrived to Trauma B from triage. CPR initiated, zoll pads placed, pt actively vomitiong, pt being suctioned to maintain and protect airway

## 2012-12-15 NOTE — Progress Notes (Signed)
Pt expired and RT turned off vent and left pt extubated at this time. RT will continue to monitor.

## 2012-12-15 NOTE — H&P (Signed)
PULMONARY  / CRITICAL CARE MEDICINE  Name: [name] MRN: [id number] DOB: 02-09-1986    ADMISSION DATE:  01/03/2013 CONSULTATION DATE:  01/05/2013  REFERRING MD :  EDP PRIMARY SERVICE:  PCCM  CHIEF COMPLAINT:  Cardiac arrest  BRIEF PATIENT DESCRIPTION: 27 yo with past medical history of seizure disorder (?) and cardiac arrest (?) brought to [facility] ED in cardiac arrest with unknown downtime after reportedly doing some drugs.  SIGNIFICANT EVENTS / STUDIES:  7/8  Head CT >>> 7/8  Hypothermia protocol started  LINES / TUBES: OETT 7/8 >>> OGT 7/8 >>> Foley 7/8 >>> R IJ CVL 7/8 >>>  CULTURES:  ANTIBIOTICS: Unasyn 7/8 >>>  The patient is encephalopathic and unable to provide history, which was obtained for available medical records.  HISTORY OF PRESENT ILLNESS:  27 yo with past medical history of seizure disorder (?) and cardiac arrest (?) brought to [facility] ED in cardiac arrest with unknown downtime after reportedly doing some drugs.  PAST MEDICAL HISTORY :  Seizures? Cardiac arrest?  MEDICATIONS: Unknown  FAMILY HISTORY:  No family history on file.  SOCIAL HISTORY:  has no tobacco, alcohol, and drug history on file.  REVIEW OF SYSTEMS:  Unable to provide.  INTERVAL HISTORY:  VITAL SIGNS: Temp:  [94.3 F (34.6 C)-95.7 F (35.4 C)] 95.7 F (35.4 C) (07/08 0315) Pulse Rate:  [25-121] 107 (07/08 0315) Resp:  [16-52] 29 (07/08 0315) BP: (82-124)/(42-67) 124/56 mmHg (07/08 0315) SpO2:  [13 %-100 %] 95 % (07/08 0315) FiO2 (%):  [100 %] 100 % (07/08 0317) Weight:  [70 kg (154 lb 5.2 oz)] 70 kg (154 lb 5.2 oz) (07/08 0300)  HEMODYNAMICS:   VENTILATOR SETTINGS: Vent Mode:  [-] PRVC FiO2 (%):  [100 %] 100 % Set Rate:  [18 bmp-32 bmp] 32 bmp Vt Set:  [500 mL] 500 mL PEEP:  [5 cmH20] 5 cmH20  INTAKE / OUTPUT: Intake/Output     07/07 0701 - 07/08 0700   I.V. (mL/kg) 1800 (25.7)   Total Intake(mL/kg) 1800 (25.7)   Net +1800        PHYSICAL EXAMINATION: General:   Appears acutely ill, mechanically ventilated, synchronous Neuro:  Encephalopathic, nonfocal, cough / gag basent HEENT:  PERRL, OETT / OGT Cardiovascular:  RRR, no m/r/g Lungs:  Bilateral diminished air entry, no w/r/r Abdomen:  Soft, nontender, bowel sounds diminished Musculoskeletal:  Moves all extremities, no edema Skin:  Intact  LABS:  Recent Labs Lab 01/03/2013 0253 12/11/2012 0254 01/02/2013 0259 Dec 18, 2012 0303  HGB 14.2 12.6  --   --   WBC 12.8*  --   --   --   PLT 326  --   --   --   NA 141 140  --   --   K 4.6 4.3  --   --   CL 101 105  --   --   CO2 12*  --   --   --   GLUCOSE 346* 323*  --   --   BUN 12 13  --   --   CREATININE 0.96 1.30*  --   --   CALCIUM 8.6  --   --   --   AST 89*  --   --   --   ALT 91*  --   --   --   ALKPHOS 68  --   --   --   BILITOT 0.1*  --   --   --   PROT 4.9*  --   --   --  ALBUMIN 2.6*  --   --   --   APTT 46*  --   --   --   INR 1.45  --   --   --   LATICACIDVEN  --   --  15.01*  --   PHART  --   --   --  6.638*  PCO2ART  --   --   --  112.2*  PO2ART  --   --   --  70.0*   No results found for this basename: GLUCAP,  in the last 168 hours  CXR:  7/8 >>> Hardware in good position, bilateral upper / middle lung zones airspace disease  ASSESSMENT / PLAN:  PULMONARY A:  Acute respiratory failure (in setting of arrest? seizure? respiratory depression form opioids?).  Aspiration pneumonia / pneumonitis.  At risk for ARDS. P:   Gaol SpO2>92, pH>7.30 Full mechanical support, high Ve as extreme acidosis, change to lung protective settings once acidosis is controlled Hold SBT while on paralyzing agent Trend ABG / CXR  CARDIOVASCULAR A: Cardiac arrest (likely secondary to respiratory failure). Cardiogenic shock. P:  Goal MAP>85 while on hypothermia protocol Trend troponin / lactate TTE  RENAL A:  AKI.  Severe combined (metabolic / respiratory) acidosis. P:   Goal CVP>10 Trend BMP Fluids per hypothermia  protocol  GASTROINTESTINAL A:  Elevated transaminases (acute alcoholic hepatitis? shocked liver?) P:   NPO as intubated TF if remains intubated > 24 hours Protonix for GI Px Trend LFT  HEMATOLOGIC A:  No active issues. P:  Trend CBC Heparin for DVT Px APTT / INR  INFECTIOUS A:  Suspected aspiration pneumonia. P:   Cultures and antibiotics as above PCT  ENDOCRINE  A:  Hyperglycemia.  No history of DM.  P:   SSI  NEUROLOGIC A:  Acute encephalopathy.  Likely anoxia.  Polysubstance abuse / overdose (drug screen positive for amphetamines, benzodiazepines, opiates and marijuana). P:   Goal RASS 0 to -1 Fentanyl / Versed / Nimbex per hypothermia protocol Head CT  TODAY'S SUMMARY: Cardiac arrest in setting of polysubstance overdose. Aspiration pneumonia / pneumonitis. Tentatively hypothermia protocol.  Await head CT.  Decrease vent settings to lung protective after acidosis is corrected.  I have personally obtained a history, examined the patient, evaluated laboratory and imaging results, formulated the assessment and plan and placed orders.  CRITICAL CARE:  The patient is critically ill with multiple organ systems failure and requires high complexity decision making for assessment and support, frequent evaluation and titration of therapies, application of advanced monitoring technologies and extensive interpretation of multiple databases. Critical Care Time devoted to patient care services described in this note is 60 minutes.   Lonia Farber, MD Pulmonary and Critical Care Medicine [facility] Pager: [phone]  01-11-13, 3:57 AM

## 2012-12-15 NOTE — Progress Notes (Signed)
Patient transported to CT and unit 2900 without incident.

## 2012-12-15 NOTE — ED Provider Notes (Signed)
History    CSN: [id number] Arrival date & time 12/11/2012  0230  First MD Initiated Contact with Patient 12/14/2012 0249     No chief complaint on file.  (Consider location/radiation/quality/duration/timing/severity/associated sxs/prior Treatment) HPI 27 yo female presents to the ER in cardiopulmonary arrest via personal vehicle.  Friends with her report she had crushed up a pill and snorted it, and then they noticed she was unconscious.  It is unclear how long from when she went unconscious to arrival here.  Pt without pulse or breathing on arrival, CPR started in personal vehicle by triage nurse.  Reported history of drug abuse, frequent miscarriages, seziures, headaches from sister.  She reports patient was being worked up in [place] for possible blood clot in brain due to sz and pain.  No other history able to be obtained.  Pt blue, with copious emesis upon arrival.  History reviewed. No pertinent past medical history. No past surgical history on file. No family history on file. History  Substance Use Topics  . Smoking status: Not on file  . Smokeless tobacco: Not on file  . Alcohol Use: Not on file   OB History   Grav Para Term Preterm Abortions TAB SAB Ect Mult Living                 Review of Systems  Unable to perform ROS: Acuity of condition    Allergies  Review of patient's allergies indicates not on file.  Home Medications  No current outpatient prescriptions on file. BP 95/43  Pulse 120  Temp(Src) 95.7 F (35.4 C)  Resp 32  Wt 167 lb 8.8 oz (76 kg)  SpO2 74% Physical Exam  Constitutional:  Obese female, multiple tattoos, multicolored hair.  Copious emesis on clothes, hair.  Cyanotic.  Unresponsive.  CPR in progress  HENT:  Head: Normocephalic and atraumatic.  Eyes:  Pupils 8mm fixed, dilated  Neck: Normal range of motion. Neck supple. No JVD present. No tracheal deviation present. No thyromegaly present.  Cardiovascular:  No heart sounds   Pulmonary/Chest: No stridor.  No respiratory effort  Abdominal: She exhibits no distension.  Musculoskeletal: She exhibits no edema.  Lymphadenopathy:    She has no cervical adenopathy.    ED Course  INTUBATION Date/Time: 12/14/2012 2:40 AM Performed by: Olivia Mackie Authorized by: Olivia Mackie Consent: The procedure was performed in an emergent situation. Indications: respiratory failure, airway protection and hypoxemia Intubation method: both glidescope and direct were utilized. Patient status: unconscious Preoxygenation: BVM Tube size: 7.0 mm Tube type: cuffed Number of attempts: 4 Ventilation between attempts: BVM Cricoid pressure: no Cords visualized: yes Post-procedure assessment: chest rise and ETCO2 monitor Breath sounds: equal and absent over the epigastrium Cuff inflated: yes ETT to lip: 22 cm Tube secured with: ETT holder Chest x-ray interpreted by me and radiologist. Chest x-ray findings: endotracheal tube in appropriate position Comments: Pt with profuse emesis, very anterior small airway view.  Difficult intubation requiring several attempts.  Emesis and pink froth noted to come from the airway from behind cords.    (including critical care time) CRITICAL CARE Performed by: Olivia Mackie Total critical care time: 60 min Critical care time was exclusive of separately billable procedures and treating other patients. Critical care was necessary to treat or prevent imminent or life-threatening deterioration. Critical care was time spent personally by me on the following activities: development of treatment plan with patient and/or surrogate as well as nursing, discussions with consultants, evaluation of patient's response  to treatment, examination of patient, obtaining history from patient or surrogate, ordering and performing treatments and interventions, ordering and review of laboratory studies, ordering and review of radiographic studies, pulse oximetry and  re-evaluation of patient's condition.   Labs Reviewed  URINALYSIS, ROUTINE W REFLEX MICROSCOPIC - Abnormal; Notable for the following:    APPearance CLOUDY (*)    All other components within normal limits  URINE RAPID DRUG SCREEN (HOSP PERFORMED) - Abnormal; Notable for the following:    Opiates POSITIVE (*)    Benzodiazepines POSITIVE (*)    Amphetamines POSITIVE (*)    Tetrahydrocannabinol POSITIVE (*)    All other components within normal limits  CBC WITH DIFFERENTIAL - Abnormal; Notable for the following:    WBC 12.8 (*)    Neutrophils Relative % 28 (*)    Lymphocytes Relative 64 (*)    Lymphs Abs 8.2 (*)    All other components within normal limits  COMPREHENSIVE METABOLIC PANEL - Abnormal; Notable for the following:    CO2 12 (*)    Glucose, Bld 346 (*)    Total Protein 4.9 (*)    Albumin 2.6 (*)    AST 89 (*)    ALT 91 (*)    Total Bilirubin 0.1 (*)    GFR calc non Af Amer 81 (*)    All other components within normal limits  PROTIME-INR - Abnormal; Notable for the following:    Prothrombin Time 17.3 (*)    All other components within normal limits  APTT - Abnormal; Notable for the following:    aPTT 46 (*)    All other components within normal limits  POCT I-STAT, CHEM 8 - Abnormal; Notable for the following:    Creatinine, Ser 1.30 (*)    Glucose, Bld 323 (*)    Calcium, Ion 1.08 (*)    All other components within normal limits  CG4 I-STAT (LACTIC ACID) - Abnormal; Notable for the following:    Lactic Acid, Venous 15.01 (*)    All other components within normal limits  POCT I-STAT 3, BLOOD GAS (G3+) - Abnormal; Notable for the following:    pH, Arterial 6.638 (*)    pCO2 arterial 112.2 (*)    pO2, Arterial 70.0 (*)    Bicarbonate 12.0 (*)    Acid-base deficit 26.0 (*)    All other components within normal limits  POCT I-STAT 3, BLOOD GAS (G3P V) - Abnormal; Notable for the following:    pH, Ven 7.165 (*)    Bicarbonate 18.6 (*)    Acid-base deficit 11.0  (*)    All other components within normal limits  MRSA PCR SCREENING  ETHANOL  PREGNANCY, URINE  PROTIME-INR  APTT  LACTIC ACID, PLASMA  LACTIC ACID, PLASMA  LACTIC ACID, PLASMA  TROPONIN I  TROPONIN I  TROPONIN I  BLOOD GAS, ARTERIAL  PROCALCITONIN  POCT I-STAT TROPONIN I   Ct Head Wo Contrast  01/02/2013   *RADIOLOGY REPORT*  Clinical Data: Unresponsive patient.  Drug overdose.  CPR. Vomiting.  CT HEAD WITHOUT CONTRAST  Technique:  Contiguous axial images were obtained from the base of the skull through the vertex without contrast.  Comparison: None.  Findings: Changes consistent with diffuse cerebral edema with loss of distinction of gray-white matter junctions and effacement of sulci and ventricles diffusely.  There is increased density along the subarachnoid spaces which is probably due to the increased cerebral edema rather than hemorrhage.  There is effacement of basal cisterns and fourth ventricle  and cisterna magna consistent with transtentorial and uncal herniation.  No abnormal extra-axial fluid collections.  No depressed skull fractures.  Opacification and air fluid levels in the maxillary antra, ethmoid air cells, and sphenoid sinuses bilaterally.  IMPRESSION: Changes of diffuse cerebral edema with associated mass effect causing transtentorial and uncal herniation.  Increased density in the subarachnoid spaces is likely due to pseudo subarachnoid hemorrhage.  Results were telephoned to the charge nurse on 2900 at 0418 hours on December 26, 2012.   Original Report Authenticated By: Burman Nieves, M.D.   Dg Chest Portable 1 View  12/26/12   *RADIOLOGY REPORT*  Clinical Data: Central line placement.  PORTABLE CHEST - 1 VIEW  Comparison: 12-26-2012 at 0303 hours.  Findings: Endotracheal tube tip now measures about 3.7 cm above the carina.  Enteric tube tip is below left hemidiaphragm consistent location in the mid stomach.  Interval placement of a right central venous catheter with tip in  the upper SVC region.  No pneumothorax. Stable appearance of bilateral upper lobe and right midlung consolidation.  No blunting of costophrenic angles.  Normal heart size.  IMPRESSION: Appliances appear in satisfactory location.  Stable appearance of bilateral parenchymal infiltrates.   Original Report Authenticated By: Burman Nieves, M.D.   Dg Chest Portable 1 View  12/26/2012   *RADIOLOGY REPORT*  Clinical Data: Post CPR.  PORTABLE CHEST - 1 VIEW  Comparison: None.  Findings: Endotracheal tube with tip about 2.3 cm above the carina. Enteric tube tip is out of the field of view but is below the left hemidiaphragm.  Shallow inspiration.  Heart size and pulmonary vascularity are normal.  There is bilateral airspace disease in the upper lungs and right midlung.  This could be due to edema, pneumonia, or aspiration.  Visualized ribs are not displaced.  No pneumothorax.  No blunting of costophrenic angles.  IMPRESSION: Appliances appear to be in satisfactory location.  Shallow inspiration.  Bilateral airspace disease.   Original Report Authenticated By: Burman Nieves, M.D.   1. Cardiac arrest   2. Acute respiratory failure   3. Aspiration pneumonia   4. Encephalopathy acute   5. Hyperglycemia     MDM  27 yo female with cardiopulmonary arrest, appears to be opiate related as she had improved response to 4 mg of narcan.  Concern for heroin/fentanyl combo.  D/w Dr Tyson Alias who requests cooling measures.  Critical care to admit.  D/w pt's sister her critical state.  Mother is on way as well.  Pt is critical, with acidosis, hypotension, early shock liver.  Pt may not survive this event.  Olivia Mackie, MD Dec 26, 2012 [phone]

## 2012-12-15 NOTE — ED Notes (Signed)
I stat chem 8 and I stat Lactic acid results given to Dr. Norlene Campbell by B. Bing Plume, EMT

## 2012-12-15 NOTE — ED Notes (Signed)
Critical care at bedside  

## 2012-12-15 NOTE — Progress Notes (Signed)
PULMONARY  / CRITICAL CARE MEDICINE  Name: [name] MRN: [id number] DOB: Oct 31, 1985    Now in severe refractory hypoxemia (SpO2 in 50's), profound shock (SBP in 40's) with head CT demonstrating cerebral edema and transtentorial / uncal herniation.  Discussed findings and prognosis with family (mother, brother, sister) in length.  The patient appears moribund and performing CPR in case of another cardiac arrest would be futile.  They agree.  DNR orders placed, RN/eMD updated.  Will continue current measures.   CRITICAL CARE:  The patient is critically ill with multiple organ systems failure and requires high complexity decision making for assessment and support, frequent evaluation and titration of therapies, application of advanced monitoring technologies and extensive interpretation of multiple databases. Additional Critical Care Time devoted to patient care services described in this note is 40 minutes.   Lonia Farber, MD Pulmonary and Critical Care Medicine [facility] Pager: [phone]  12/26/2012, 5:56 AM

## 2012-12-15 NOTE — Progress Notes (Signed)
27yo female admitted w/ cardiac arrest d/t overdose, now on hypothermia protocol, pt vomited during CPR, to begin IV ABX for presumed aspiration.  Will start Unasyn 3g IV Q6H for CrCl >50 and monitor CBC, cx, and clinical progress.  Vernard Gambles, PharmD, BCPS 12/10/2012 4:03 AM

## 2012-12-15 NOTE — Progress Notes (Signed)
Interdisciplinary family meeting with mother of the patient in a cousin and the presence of nurse and chaplain and physician. Discussion led by myself Dr. Marchelle Gearing.  Mother and other family member clearly aware that patient is actively dying. They're aware that patient had cardiac arrest and is in coma. They are in absolute state of emotional shock and crying loudly. Even at the outset when I met with her two times she clearly said, "I'm not going to let you to the plug on her". She clearly did not want to entertain any conversation about terminal wean.  At my exam I suspect patient is brain dead. Pupils are fixed and dilated. There is no gag or cough response. There is no corneal response. There is no motor response. Glasgow Coma Scale is 3. She breathes with the ventilator even on the respirator is 8.  In addition she's and refractory shock and is requiring 100% oxygen and she is hypoxemic.  Mother very angry at the people who she was hanging out with and who apparently administered toxic drugs to the patient. Mother upset that these people abandoned and allow the patient to die. Mother is appreciative of healthcare providers  Plan Continue spiritual support Continue current medical care DNA R. in case of cardiac arrest Prognosis is in the order of minutes or hours Depending on course we'll evaluate for brain death tomorrow 2013/01/05   45 minutes additional critical care time   Dr. Kalman Shan, M.D., [facility].C.P Pulmonary and Critical Care Medicine Staff Physician [facility] Pulmonary and Critical Care Pager: [phone], If no answer or between  15:00h - 7:00h: call [phone]  01/07/2013 11:26 AM

## 2012-12-15 NOTE — Progress Notes (Addendum)
Expiration Note:  Pt. expired @1225  with family at bedside.  No heart sounds or breath sounds noted as per auscultation x 1 full minute by Harmon Dun. & Gaspar Garbe, R.N.  Dr. Marchelle Gearing notified of expiration.  Chaplain @ bedside w/family. [facility] notified.  Purple and clear stone ring and red rope bracelet given to cousin Lawanna Kobus Hockinson - [phone] cell).  Medical Examiner also notified.

## 2012-12-15 NOTE — Code Documentation (Signed)
Cold saline started on pt

## 2012-12-15 NOTE — Procedures (Signed)
Name:  [name] MRN:  [id number] DOB:  1986-01-10  PROCEDURE NOTE  Procedure:  Ultrasound-guided central venous catheter placement.  Indications:  Need for intravenous access and hemodynamic monitoring.  Consent:  Consent was implied due to the emergency nature of the procedure.  Anesthesia:  A total of 10 mL of 1% Lidocaine was used for local infiltration anesthesia.  Procedure summary:  Appropriate equipment was assembled.  The patient was identified as [name] and safety timeout was performed. The patient was placed in Trendelenburg position.  Sterile technique was used. The patient's right neck region was prepped using chlorhexidine / alcohol scrub and the field was draped in usual sterile fashion with full body drape. The right internal jugular vein and the right carotid artery were identified by ultrasound, the patency was evaluated and images were documented. After the adequate anesthesia was achieved, the vein was cannulated with the introducer needle under sonographic guidance without difficulty. A guide wire was advanced through the introducer needle, which was then withdrawn. A small skin incision was made at the point of wire entry, the dilator was inserted over the guide wire and appropriate dilation was obtained. The dilator was removed and triple-lumen catheter was advanced over the guide wire, which was then removed.  All ports were aspirated and flushed with normal saline without difficulty. The catheter was secured into place with sutures. Antibiotic patch was placed and sterile dressing was applied. Post-procedure chest x-ray was ordered.  Complications:  No immediate complications were noted.  Hemodynamic parameters and oxygenation remained stable throughout the procedure.  Estimated blood loss:  Less then 5 mL.  Orlean Bradford, M.D. Pulmonary and Critical Care Medicine [facility] Pager: [phone]  12/08/2012, 4:40 AM

## 2012-12-15 NOTE — Progress Notes (Signed)
Chaplain Note: Chaplain visited with pt and pt's family.  Pt was in bed, intubated, and non-responsive.  Pt's family and friends were gathered at bedside.  Pt's mother was extremely distraught and angry over the circumstances that resulted in her daughter being brought to this hospital.  Chaplain provided spiritual comfort and support for pt's family while they awaited, during, and following, patient's passing.  Pt's mother, nurse, and family expressed appreciation for chaplain support.  12-27-2012 1300  Clinical Encounter Type  Visited With Patient and family together  Visit Type Spiritual support;Death;Patient actively dying  Referral From Chaplain;Nurse  Spiritual Encounters  Spiritual Needs Emotional;Grief support  Stress Factors  Family Stress Factors Family relationships;Loss;Major life changes  Verdie Shire, Chaplain [phone]

## 2012-12-15 NOTE — Care Management Note (Signed)
    Page 1 of 1   12/14/2012     10:45:06 AM   CARE MANAGEMENT NOTE 12/30/2012  Patient:  [name], [name]   Account Number:  [id number]  Date Initiated:  12/08/2012  Documentation initiated by:  Junius Creamer  Subjective/Objective Assessment:   adm w cardiac arrest     Action/Plan:   livs alone   Anticipated DC Date:     Anticipated DC Plan:        DC Planning Services  CM consult      Choice offered to / List presented to:             Status of service:   Medicare Important Message given?   (If response is "NO", the following Medicare IM given date fields will be blank) Date Medicare IM given:   Date Additional Medicare IM given:    Discharge Disposition:    Per UR Regulation:  Reviewed for med. necessity/level of care/duration of stay  If discussed at Long Length of Stay Meetings, dates discussed:    Comments:

## 2012-12-15 NOTE — Progress Notes (Signed)
Chaplain called to ED upon arrival of pt in CPR.   Although no interaction with patient was possible, chaplain stayed nearby to pray for patient and assist in locating family if possible.  Eventually sister was located, then mother.  Chaplain escorted family to consultation with doctor, during which the doctor relayed the grave prognosis.  Family chose to remain outside the ER with children....then left the hospital.   They were called and asked to return to the hospital within 18 minutes of arriving at home.  Family continued to try to locate brother of patient, and fiance who is in [place]T.   Chaplain will follow, and will ask daytime chaplain to continue as needed.  Rev. Spry, [place] [phone]

## 2012-12-15 NOTE — Progress Notes (Signed)
Nutrition Brief Note  Chart reviewed.  Pt is actively dying.  No further nutrition interventions warranted at this time.  Planning for evaluation for brain death tomorrow 2022/12/27).  If enteral nutrition warranted, recommend initiating Adult Tube Feeding Protocol which includes consult to RD for management.   Loyce Dys, MS RD LDN Clinical Inpatient Dietitian Pager: [phone] Weekend/After hours pager: [phone]

## 2012-12-16 ENCOUNTER — Encounter (HOSPITAL_COMMUNITY): Payer: Self-pay | Admitting: *Deleted

## 2013-01-08 DEATH — deceased

## 2013-01-13 NOTE — Discharge Summary (Signed)
Name: [name] MRN: [id number] DOB: 02/07/1986  PCCM DEATH NOTE  Time of death:  2013/01/08  12:25  Cause of death: Cardiogenic shock  Discharge diagnoses: Acute respiratory failure Aspiration pneumonia ARDS S/p cardiac arrest Cardiogenic shock Acute kidney injury Acute encephalopathy Anoxia Polysubstance overdose   Brief hospital course:  27 yo with past medical history of seizure disorder (?) and cardiac arrest (?) brought to [facility] ED in cardiac arrest with unknown downtime after reportedly doing some drugs.  Developed severe refractory hypoxemia (SpO2 in 50's), profound shock (SBP in 40's) with head CT demonstrating cerebral edema and transtentorial / uncal herniation. Findings and prognosis were discussed with family (mother, brother, sister) in length. Per their request DNR orders were placed.  Patient expired shortly after.  No resuscitation was attempted.  Lonia Farber, MD Pulmonary and Critical Care Medicine [facility] Pager: [phone]

## 2013-09-08 DEATH — deceased

## 2013-12-23 IMAGING — CR DG CHEST 1V PORT
1 series · 1 of 1 positions shown · non-contrast
Comparison: 12/15/2012 at 0909 hours.

CLINICAL DATA: Central line placement.

PORTABLE CHEST - 1 VIEW

[AP]
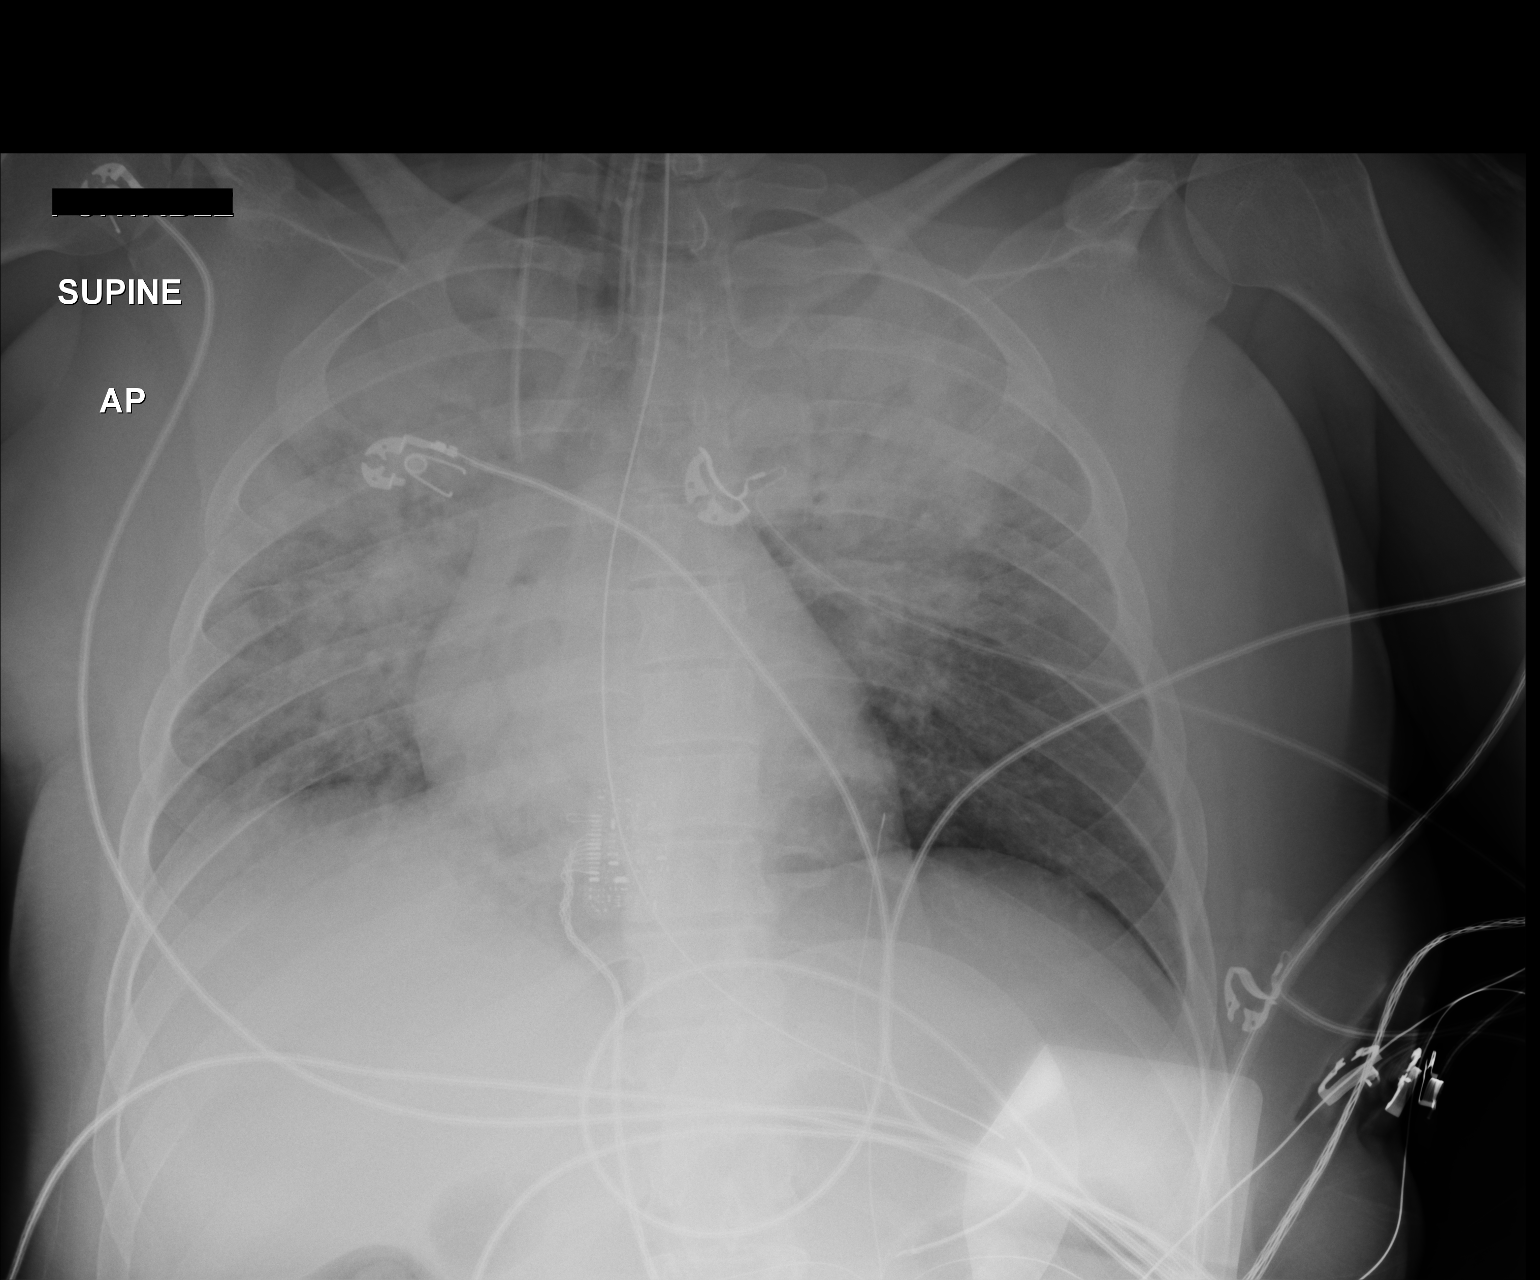

[1 of 1 positions shown; findings below may reference images not displayed]

FINDINGS: Endotracheal tube tip now measures about 3.7 cm above the
carina.  Enteric tube tip is below left hemidiaphragm consistent
location in the mid stomach.  Interval placement of a right central
venous catheter with tip in the upper SVC region.  No pneumothorax.
Stable appearance of bilateral upper lobe and right midlung
consolidation.  No blunting of costophrenic angles.  Normal heart
size.
IMPRESSION: Appliances appear in satisfactory location.  Stable appearance of
bilateral parenchymal infiltrates.

## 2013-12-23 IMAGING — CT CT HEAD W/O CM
1 of 2 series · 15 of 30 positions shown, 19 images · non-contrast
Comparison: None.

CLINICAL DATA: Unresponsive patient.  Drug overdose.  CPR.
Vomiting.

CT HEAD WITHOUT CONTRAST
TECHNIQUE: Contiguous axial images were obtained from the base of
the skull through the vertex without contrast.

[Series 3: head 2.0 h70h · axial · 0.41mm/px · z∈[-238,-94]mm · 15 of 82 slices shown, 19 images]
[im 5/82  brain]
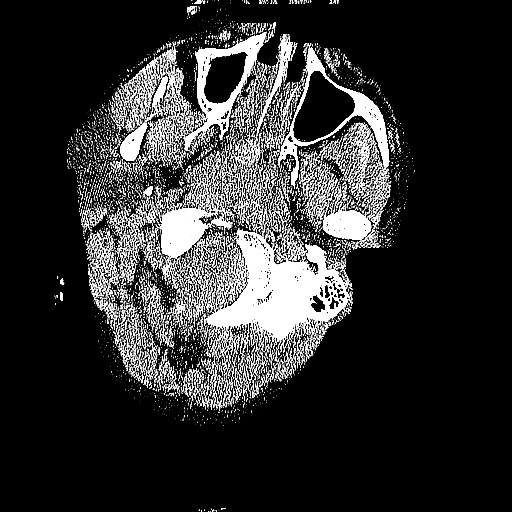
[im 5/82  bone]
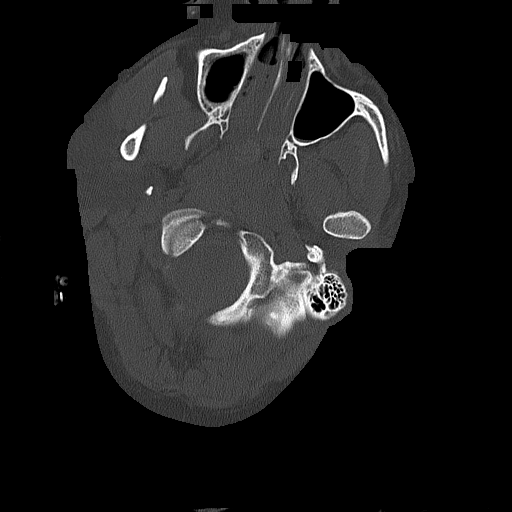
[im 9/82  brain]
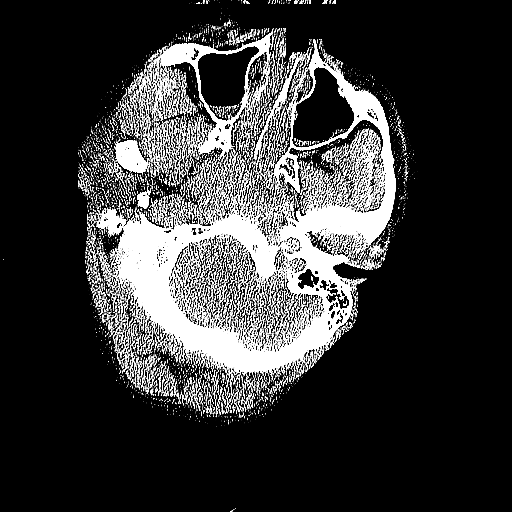
[im 17/82  brain]
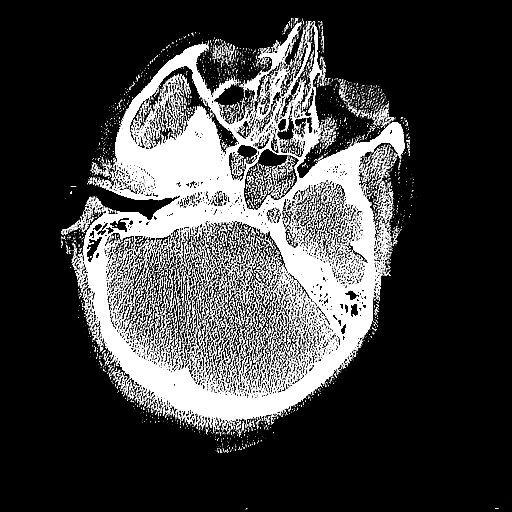
[im 21/82  brain]
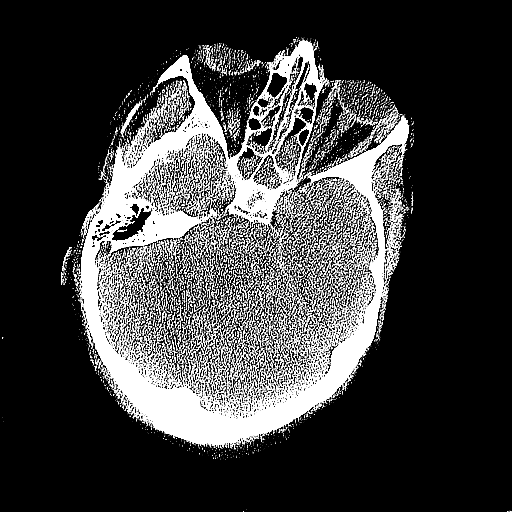
[im 25/82  brain]
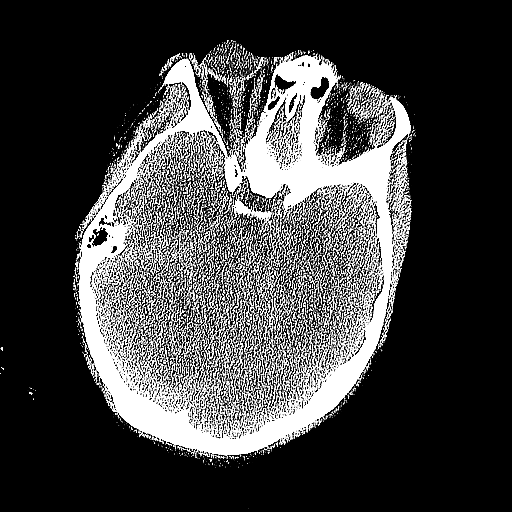
[im 25/82  bone]
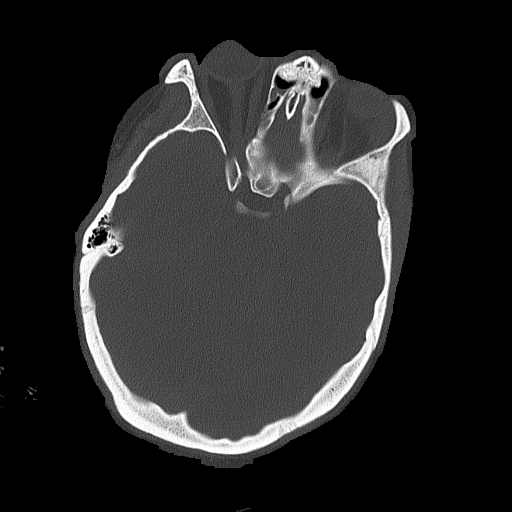
[im 29/82  brain]
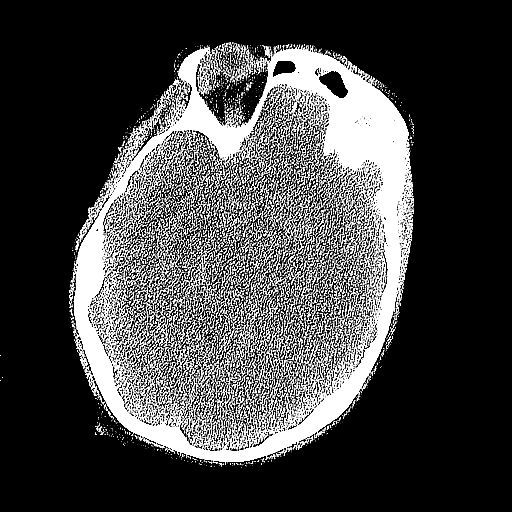
[im 37/82  brain]
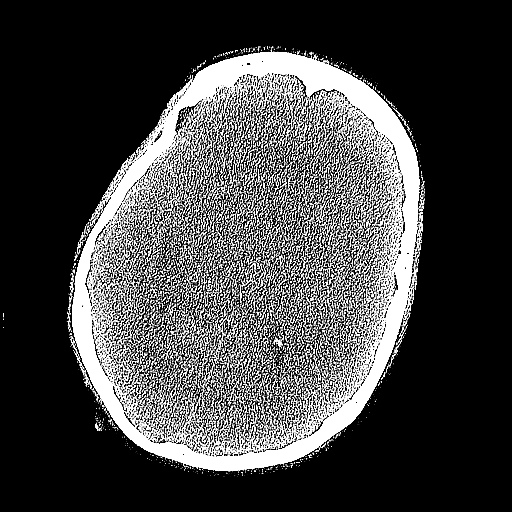
[im 41/82  brain]
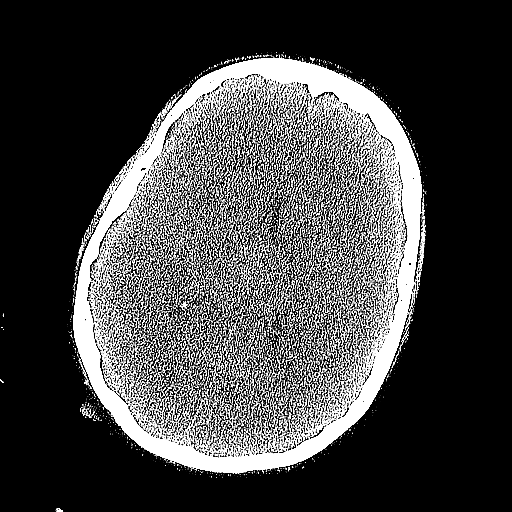
[im 45/82  brain]
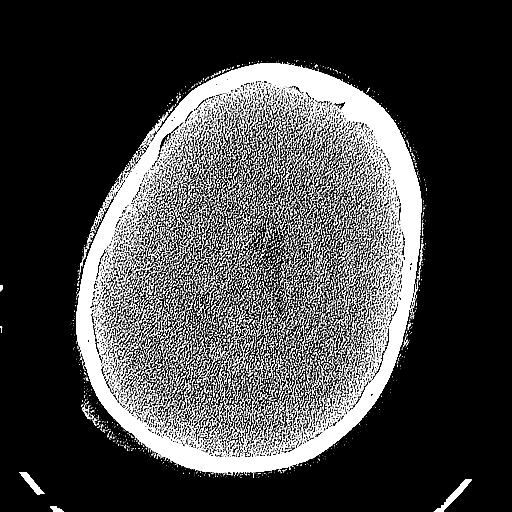
[im 45/82  bone]
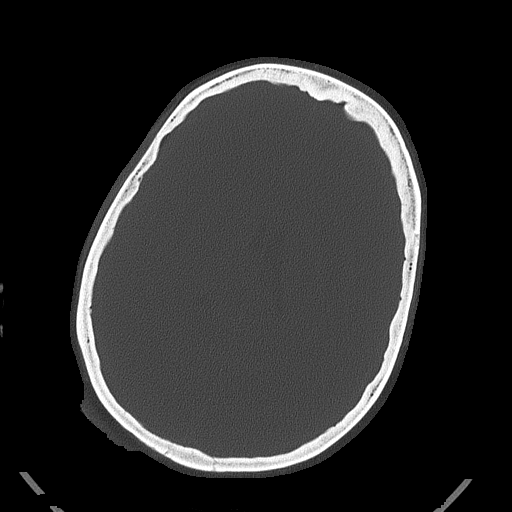
[im 53/82  brain]
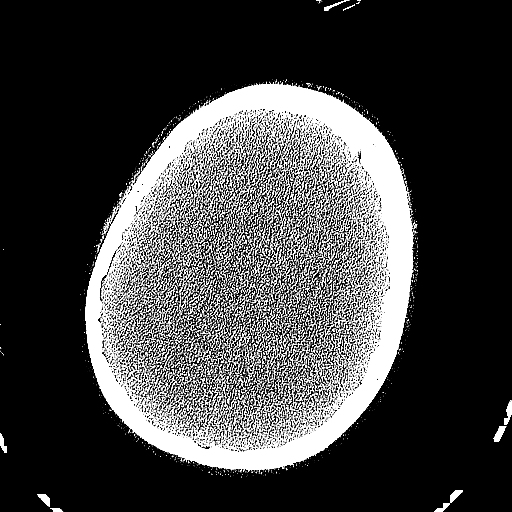
[im 57/82  brain]
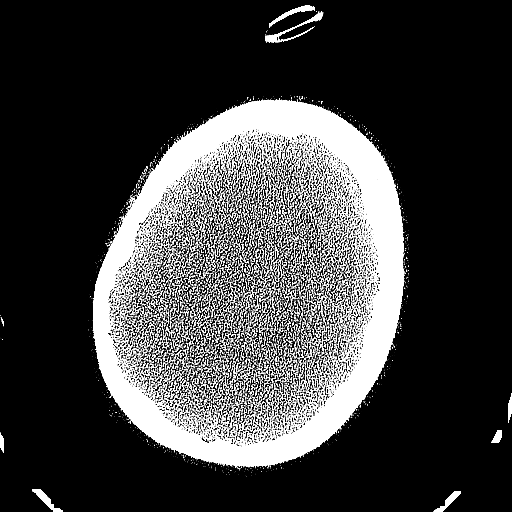
[im 61/82  brain]
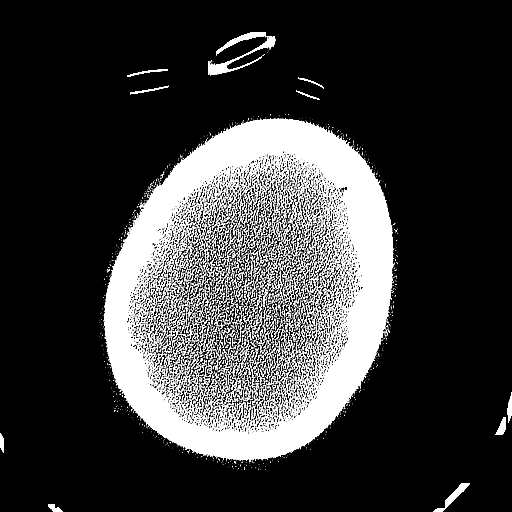
[im 65/82  brain]
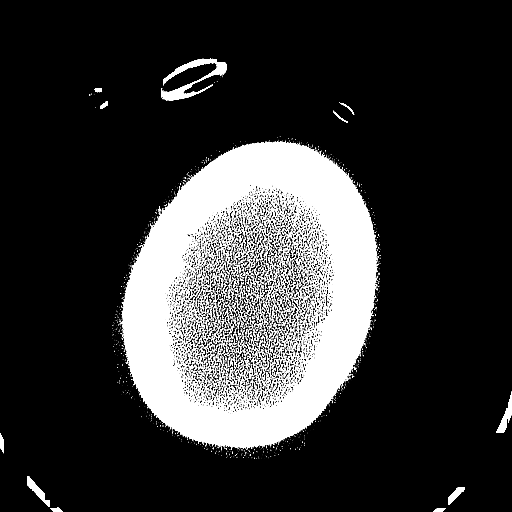
[im 65/82  bone]
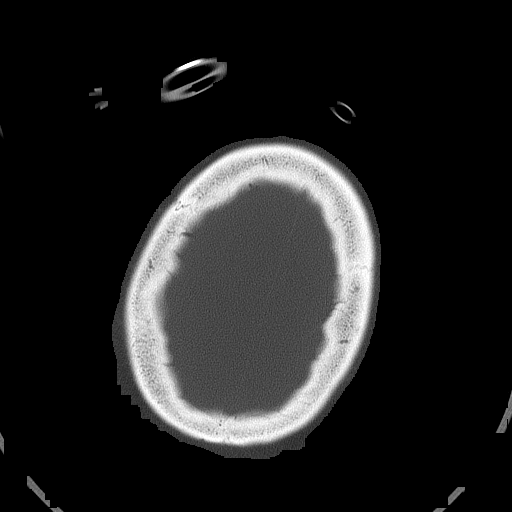
[im 73/82  brain]
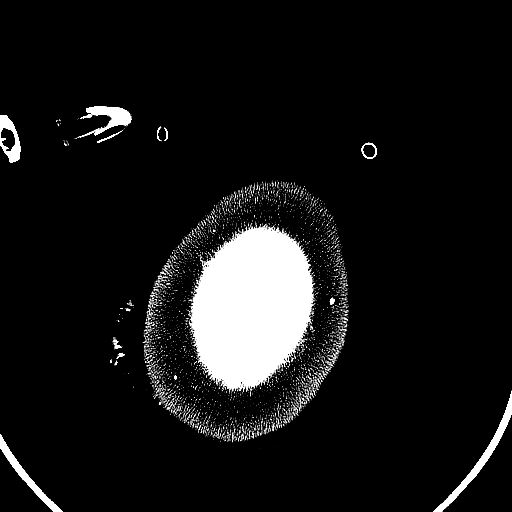
[im 77/82  brain]
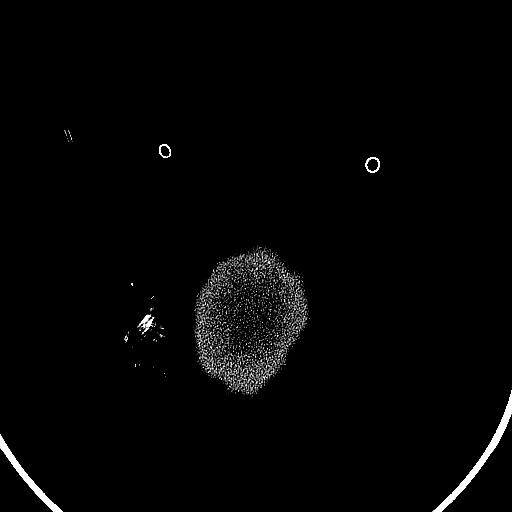

[15 of 30 positions shown; findings below may reference images not displayed]

FINDINGS: Changes consistent with diffuse cerebral edema with loss
of distinction of gray-white matter junctions and effacement of
sulci and ventricles diffusely.  There is increased density along
the subarachnoid spaces which is probably due to the increased
cerebral edema rather than hemorrhage.  There is effacement of
basal cisterns and fourth ventricle and cisterna magna consistent
with transtentorial and uncal herniation.  No abnormal extra-axial
fluid collections.  No depressed skull fractures.  Opacification
and air fluid levels in the maxillary antra, ethmoid air cells, and
sphenoid sinuses bilaterally.
IMPRESSION: Changes of diffuse cerebral edema with associated mass effect
causing transtentorial and uncal herniation.  Increased density in
the subarachnoid spaces is likely due to pseudo subarachnoid
hemorrhage.

Results were telephoned to the charge nurse on [id number] at 4985 hours
on 12/15/2012.

## 2014-04-11 ENCOUNTER — Encounter (HOSPITAL_COMMUNITY): Payer: Self-pay | Admitting: *Deleted
